# Patient Record
Sex: Male | Born: 1970
Health system: Southern US, Community
[De-identification: ages and names within clinical notes are randomized; demographics above are authoritative.]

## PROBLEM LIST (undated history)

## (undated) DIAGNOSIS — I471 Supraventricular tachycardia, unspecified: Secondary | ICD-10-CM

## (undated) DIAGNOSIS — I10 Essential (primary) hypertension: Secondary | ICD-10-CM

## (undated) DIAGNOSIS — N200 Calculus of kidney: Secondary | ICD-10-CM

## (undated) HISTORY — DX: Supraventricular tachycardia: I47.1

## (undated) HISTORY — PX: CYSTECTOMY: SUR359

## (undated) HISTORY — DX: Supraventricular tachycardia, unspecified: I47.10

## (undated) HISTORY — DX: Essential (primary) hypertension: I10

---

## 2008-12-08 ENCOUNTER — Emergency Department (HOSPITAL_COMMUNITY): Admission: EM | Admit: 2008-12-08 | Discharge: 2008-12-08 | Payer: Self-pay | Admitting: Emergency Medicine

## 2010-04-07 ENCOUNTER — Ambulatory Visit (INDEPENDENT_AMBULATORY_CARE_PROVIDER_SITE_OTHER): Payer: Federal, State, Local not specified - PPO | Admitting: Cardiology

## 2010-04-07 DIAGNOSIS — Z79899 Other long term (current) drug therapy: Secondary | ICD-10-CM

## 2010-04-07 DIAGNOSIS — I119 Hypertensive heart disease without heart failure: Secondary | ICD-10-CM

## 2010-05-25 LAB — COMPREHENSIVE METABOLIC PANEL
AST: 25 U/L (ref 0–37)
Albumin: 4.6 g/dL (ref 3.5–5.2)
CO2: 25 mEq/L (ref 19–32)
Calcium: 9.9 mg/dL (ref 8.4–10.5)
Creatinine, Ser: 0.85 mg/dL (ref 0.4–1.5)
GFR calc non Af Amer: >60 mL/min (ref >60)
Potassium: 4 mEq/L (ref 3.5–5.1)
Total Bilirubin: 0.7 mg/dL (ref 0.3–1.2)
Total Protein: 7.6 g/dL (ref 6.0–8.3)

## 2010-05-25 LAB — CBC
HCT: 47.8 % (ref 39.0–52.0)
MCV: 91.2 fL (ref 78.0–100.0)
RBC: 5.23 MIL/uL (ref 4.22–5.81)
WBC: 7.3 10*3/uL (ref 4.0–10.5)

## 2010-05-25 LAB — POCT CARDIAC MARKERS: CKMB, poc: <1 ng/mL — ABNORMAL LOW (ref 1.0–8.0)

## 2010-05-25 LAB — MAGNESIUM: Magnesium: 2 mg/dL (ref 1.5–2.5)

## 2010-05-25 LAB — DIFFERENTIAL
Basophils Absolute: 0 10*3/uL (ref 0.0–0.1)
Lymphocytes Relative: 18 % (ref 12–46)
Neutro Abs: 5.6 10*3/uL (ref 1.7–7.7)
Neutrophils Relative %: 76 % (ref 43–77)

## 2010-05-25 LAB — PROTIME-INR: Prothrombin Time: 12.4 seconds (ref 11.6–15.2)

## 2010-06-15 ENCOUNTER — Emergency Department (HOSPITAL_COMMUNITY): Payer: Federal, State, Local not specified - PPO

## 2010-06-15 ENCOUNTER — Emergency Department (HOSPITAL_COMMUNITY)
Admission: EM | Admit: 2010-06-15 | Discharge: 2010-06-16 | Disposition: A | Discharge status: Home / Self Care | Payer: Federal, State, Local not specified - PPO | Attending: Emergency Medicine | Admitting: Emergency Medicine

## 2010-06-15 DIAGNOSIS — I10 Essential (primary) hypertension: Secondary | ICD-10-CM | POA: Insufficient documentation

## 2010-06-15 DIAGNOSIS — R000 Tachycardia, unspecified: Secondary | ICD-10-CM | POA: Insufficient documentation

## 2010-06-15 DIAGNOSIS — R002 Palpitations: Secondary | ICD-10-CM | POA: Insufficient documentation

## 2010-06-15 LAB — POCT CARDIAC MARKERS: CKMB, poc: <1 ng/mL — ABNORMAL LOW (ref 1.0–8.0)

## 2010-06-15 LAB — POCT I-STAT, CHEM 8
BUN: 17 mg/dL (ref 6–23)
Creatinine, Ser: 1 mg/dL (ref 0.4–1.5)
HCT: 44 % (ref 39.0–52.0)
Potassium: 3.4 mEq/L — ABNORMAL LOW (ref 3.5–5.1)
Sodium: 135 mEq/L (ref 135–145)
TCO2: 25 mmol/L (ref 0–100)

## 2010-06-16 ENCOUNTER — Telehealth: Payer: Self-pay | Admitting: Cardiology

## 2010-06-16 LAB — DIFFERENTIAL
Basophils Relative: 0 % (ref 0–1)
Eosinophils Absolute: 0.2 10*3/uL (ref 0.0–0.7)
Lymphocytes Relative: 40 % (ref 12–46)
Monocytes Absolute: 0.5 10*3/uL (ref 0.1–1.0)
Monocytes Relative: 8 % (ref 3–12)
Neutrophils Relative %: 50 % (ref 43–77)

## 2010-06-16 LAB — CBC
HCT: 42.3 % (ref 39.0–52.0)
MCH: 31 pg (ref 26.0–34.0)
MCV: 86.9 fL (ref 78.0–100.0)
Platelets: 278 10*3/uL (ref 150–400)
RDW: 12 % (ref 11.5–15.5)

## 2010-06-16 NOTE — Telephone Encounter (Signed)
Left message

## 2010-06-16 NOTE — Telephone Encounter (Signed)
FYI AT 10PM HAD TACHACARDICA  WENT TO [place] OVER NIGHT  IS DOING OK TODAY

## 2010-06-28 NOTE — Telephone Encounter (Signed)
Patient never called back.  Had discussed with Lawson Fiscal since Dr. Patty Sermons was out of the office and was going to get an event monitor on patient.

## 2010-09-12 ENCOUNTER — Other Ambulatory Visit: Payer: Self-pay | Admitting: Cardiology

## 2010-09-12 DIAGNOSIS — I119 Hypertensive heart disease without heart failure: Secondary | ICD-10-CM

## 2010-09-12 NOTE — Telephone Encounter (Signed)
escribe request  

## 2010-10-11 ENCOUNTER — Other Ambulatory Visit: Payer: Self-pay | Admitting: Cardiology

## 2010-10-11 DIAGNOSIS — I119 Hypertensive heart disease without heart failure: Secondary | ICD-10-CM

## 2010-10-11 NOTE — Telephone Encounter (Signed)
escribe request  

## 2011-04-13 ENCOUNTER — Other Ambulatory Visit: Payer: Self-pay | Admitting: Cardiology

## 2011-04-13 NOTE — Telephone Encounter (Signed)
Refilled lisinopril

## 2011-05-01 ENCOUNTER — Ambulatory Visit (INDEPENDENT_AMBULATORY_CARE_PROVIDER_SITE_OTHER): Payer: Federal, State, Local not specified - PPO | Admitting: Cardiology

## 2011-05-01 ENCOUNTER — Encounter: Payer: Self-pay | Admitting: Cardiology

## 2011-05-01 VITALS — BP 155/88 | HR 76 | Ht 70.0 in | Wt 200.2 lb

## 2011-05-01 DIAGNOSIS — I119 Hypertensive heart disease without heart failure: Secondary | ICD-10-CM

## 2011-05-01 DIAGNOSIS — R002 Palpitations: Secondary | ICD-10-CM | POA: Insufficient documentation

## 2011-05-01 MED ORDER — AMLODIPINE BESYLATE 10 MG PO TABS: 10.0000 mg | ORAL_TABLET | Freq: Every day | ORAL | Status: DC

## 2011-05-01 MED ORDER — LISINOPRIL-HYDROCHLOROTHIAZIDE 20-25 MG PO TABS: 1.0000 | ORAL_TABLET | Freq: Every day | ORAL | Status: DC

## 2011-05-01 NOTE — Assessment & Plan Note (Signed)
Checked his blood pressure at home and it is in the range of 130 up into 150 systolic and the diastolic runs between 80 and 90.  He has gained weight since we last saw him and he is trying to lose the weight.  He is not having any chest pain or shortness of breath or palpitations.  He's had no further palpitations since he cut caffeine from his diet

## 2011-05-01 NOTE — Progress Notes (Signed)
Leroy Leonard Date of Birth:  17-May-1970 [facility] [address] [place], [place]  [address] [phone]  Fax   [phone]  HPI: This pleasant 41 year old gentleman is seen for a scheduled followup office visit.  He has a history of essential hypertension.  Is seen today after a long absence.  Since last visit he's had no new cardiac symptoms.  Since we last saw him he has bought a new house and [place] his wife and 2 young children.  He works with the [employer].  He does not use tobacco.  He continues to try to get moderate exercise and also does some weight training at home.  Current Outpatient Prescriptions  Medication Sig Dispense Refill  . amLODipine (NORVASC) 10 MG tablet Take 1 tablet (10 mg total) by mouth daily.  30 tablet  11  . lisinopril-hydrochlorothiazide (PRINZIDE,ZESTORETIC) 20-25 MG per tablet Take 1 tablet by mouth daily.  30 tablet  11  . metoprolol (TOPROL-XL) 100 MG 24 hr tablet TAKE ONE TABLET BY MOUTH EVERY DAY  30 tablet  11  . Multiple Vitamin (MULTIVITAMIN) tablet Take 1 tablet by mouth daily.        No Known Allergies  Patient Active Problem List  Diagnoses  . Benign hypertensive heart disease without heart failure    History  Smoking status  . Former Smoker  Smokeless tobacco  . Not on file    History  Alcohol Use: Not on file    No family history on file.  Review of Systems: The patient denies any heat or cold intolerance.  No weight gain or weight loss.  The patient denies headaches or blurry vision.  There is no cough or sputum production.  The patient denies dizziness.  There is no hematuria or hematochezia.  The patient denies any muscle aches or arthritis.  The patient denies any rash.  The patient denies frequent falling or instability.  There is no history of depression or anxiety.  All other systems were reviewed and are negative.   Physical Exam: Filed Vitals:   05/01/11  1054  BP: 155/88  Pulse: 76   general appearance reveals a well-developed well-nourished gentleman in no distress.The head and neck exam reveals pupils equal and reactive.  Extraocular movements are full.  There is no scleral icterus.  The mouth and pharynx are normal.  The neck is supple.  The carotids reveal no bruits.  The jugular venous pressure is normal.  The  thyroid is not enlarged.  There is no lymphadenopathy.  The chest is clear to percussion and auscultation.  There are no rales or rhonchi.  Expansion of the chest is symmetrical.  The precordium is quiet.  The first heart sound is normal.  The second heart sound is physiologically split.  There is no murmur gallop rub or click.  There is no abnormal lift or heave.  The abdomen is soft and nontender.  The bowel sounds are normal.  The liver and spleen are not enlarged.  There are no abdominal masses.  There are no abdominal bruits.  Extremities reveal good pedal pulses.  There is no phlebitis or edema.  There is no cyanosis or clubbing.  Strength is normal and symmetrical in all extremities.  There is no lateralizing weakness.  There are no sensory deficits.  The skin is warm and dry.  There is no rash.      Assessment / Plan: Continue same medication except increase  amlodipine to 10 mg daily. Return in 6 months for followup office visit and we will get fasting lipid panel and chemistries as well

## 2011-05-01 NOTE — Patient Instructions (Signed)
Increase your Amlodipine to 10 mg daily   Work harder on weight loss  Your physician wants you to follow-up in: 6 months You will receive a reminder letter in the mail two months in advance. If you don't receive a letter, please call our office to schedule the follow-up appointment.

## 2011-05-01 NOTE — Assessment & Plan Note (Signed)
He has not been aware of any cardiac irregularity.  He continues to avoid caffeine

## 2011-07-02 ENCOUNTER — Encounter: Payer: Self-pay | Admitting: *Deleted

## 2011-10-10 ENCOUNTER — Other Ambulatory Visit: Payer: Self-pay | Admitting: Cardiology

## 2011-10-10 NOTE — Telephone Encounter (Signed)
Refilled metoprolol 

## 2011-11-13 ENCOUNTER — Other Ambulatory Visit (INDEPENDENT_AMBULATORY_CARE_PROVIDER_SITE_OTHER): Payer: Federal, State, Local not specified - PPO

## 2011-11-13 DIAGNOSIS — I119 Hypertensive heart disease without heart failure: Secondary | ICD-10-CM

## 2011-11-13 LAB — HEPATIC FUNCTION PANEL
ALT: 41 U/L (ref 0–53)
AST: 27 U/L (ref 0–37)
Alkaline Phosphatase: 58 U/L (ref 39–117)
Bilirubin, Direct: 0 mg/dL (ref 0.0–0.3)
Total Bilirubin: 0.7 mg/dL (ref 0.3–1.2)

## 2011-11-13 LAB — LDL CHOLESTEROL, DIRECT: Direct LDL: 150.8 mg/dL

## 2011-11-13 LAB — BASIC METABOLIC PANEL
BUN: 16 mg/dL (ref 6–23)
Creatinine, Ser: 1 mg/dL (ref 0.4–1.5)
Sodium: 140 mEq/L (ref 135–145)

## 2011-11-13 LAB — LIPID PANEL
Cholesterol: 232 mg/dL — ABNORMAL HIGH (ref 0–200)
HDL: 42.5 mg/dL (ref >39.00)
VLDL: 39.8 mg/dL (ref 0.0–40.0)

## 2011-11-13 NOTE — Progress Notes (Signed)
Quick Note:  Please make copy of labs for patient visit. ______ 

## 2011-11-20 ENCOUNTER — Ambulatory Visit (INDEPENDENT_AMBULATORY_CARE_PROVIDER_SITE_OTHER): Payer: Federal, State, Local not specified - PPO | Admitting: Cardiology

## 2011-11-20 ENCOUNTER — Encounter: Payer: Self-pay | Admitting: Cardiology

## 2011-11-20 VITALS — BP 137/84 | HR 80 | Ht 70.0 in | Wt 201.0 lb

## 2011-11-20 DIAGNOSIS — E7800 Pure hypercholesterolemia, unspecified: Secondary | ICD-10-CM

## 2011-11-20 DIAGNOSIS — I119 Hypertensive heart disease without heart failure: Secondary | ICD-10-CM

## 2011-11-20 MED ORDER — ATORVASTATIN CALCIUM 40 MG PO TABS: 40.0000 mg | ORAL_TABLET | Freq: Every day | ORAL | Status: DC

## 2011-11-20 NOTE — Assessment & Plan Note (Signed)
Blood pressure has been remaining acceptable on current therapy.  No headaches or dizzy spells or palpitations

## 2011-11-20 NOTE — Patient Instructions (Addendum)
Start Lipitor 40 mg daily   Come back for fasting labs 01/22/12 after 8:30  Your physician wants you to follow-up in: 6 months with fasting labs (lp/bmet/hfp)  You will receive a reminder letter in the mail two months in advance. If you don't receive a letter, please call our office to schedule the follow-up appointment.

## 2011-11-20 NOTE — Progress Notes (Signed)
Leroy Leonard Date of Birth:  1971/01/12 [facility] [address] [place], [place]  [address] [phone]         Fax   [phone]  History of Present Illness: This pleasant 41 year old gentleman is seen for a scheduled followup office visit. He has a history of essential hypertension. Is seen today after a six-month absence. Since last visit he's had no new cardiac symptoms. Since we last saw him he has bought a new house and now [place] the country with his wife and 2 young children. He works with the [employer]. He does not use tobacco. He continues to try to get moderate exercise and also does some weight training at home.  He and his wife have been doing some biking on the weekends riding 10-12 miles on the paths around the [facility] system.   Current Outpatient Prescriptions  Medication Sig Dispense Refill  . amLODipine (NORVASC) 10 MG tablet Take 1 tablet (10 mg total) by mouth daily.  30 tablet  11  . lisinopril-hydrochlorothiazide (PRINZIDE,ZESTORETIC) 20-25 MG per tablet Take 1 tablet by mouth daily.  30 tablet  11  . metoprolol succinate (TOPROL-XL) 100 MG 24 hr tablet TAKE ONE TABLET BY MOUTH EVERY DAY  30 tablet  5  . Multiple Vitamin (MULTIVITAMIN) tablet Take 1 tablet by mouth daily.      Marland Kitchen atorvastatin (LIPITOR) 40 MG tablet Take 1 tablet (40 mg total) by mouth daily.  30 tablet  5    No Known Allergies  Patient Active Problem List  Diagnosis  . Benign hypertensive heart disease without heart failure  . Rapid palpitations    History  Smoking status  . Former Smoker  Smokeless tobacco  . Not on file    History  Alcohol Use  . 2.0 oz/week  . 4 drink(s) per week    Family History  Problem Relation Age of Onset  . COPD    . Hypertension      Review of Systems: Constitutional: no fever chills diaphoresis or fatigue or change in weight.  Head and neck: no hearing loss, no epistaxis, no photophobia or visual  disturbance. Respiratory: No cough, shortness of breath or wheezing. Cardiovascular: No chest pain peripheral edema, palpitations. Gastrointestinal: No abdominal distention, no abdominal pain, no change in bowel habits hematochezia or melena. Genitourinary: No dysuria, no frequency, no urgency, no nocturia. Musculoskeletal:No arthralgias, no back pain, no gait disturbance or myalgias. Neurological: No dizziness, no headaches, no numbness, no seizures, no syncope, no weakness, no tremors. Hematologic: No lymphadenopathy, no easy bruising. Psychiatric: No confusion, no hallucinations, no sleep disturbance.    Physical Exam: Filed Vitals:   11/20/11 1513  BP: 137/84  Pulse: 80   the general appearance reveals a well-developed well-nourished gentleman in no distress.The head and neck exam reveals pupils equal and reactive.  Extraocular movements are full.  There is no scleral icterus.  The mouth and pharynx are normal.  The neck is supple.  The carotids reveal no bruits.  The jugular venous pressure is normal.  The  thyroid is not enlarged.  There is no lymphadenopathy.  The chest is clear to percussion and auscultation.  There are no rales or rhonchi.  Expansion of the chest is symmetrical.  The precordium is quiet.  The first heart sound is normal.  The second heart sound is physiologically split.  There is no murmur gallop rub or click.  There is no abnormal lift or heave.  The  abdomen is soft and nontender.  The bowel sounds are normal.  The liver and spleen are not enlarged.  There are no abdominal masses.  There are no abdominal bruits.  Extremities reveal good pedal pulses.  There is no phlebitis or edema.  There is no cyanosis or clubbing.  Strength is normal and symmetrical in all extremities.  There is no lateralizing weakness.  There are no sensory deficits.  The skin is warm and dry.  There is no rash.    Assessment / Plan: Continue present medication and add Lipitor 40 mg one  daily. Return in 2 months for lab work alone and in 6 months for office visit and fasting lab work

## 2011-11-20 NOTE — Assessment & Plan Note (Signed)
We reviewed his recent blood work which shows that his LDL cholesterol is 150 and his total cholesterol is 232.  He has a history of premature coronary artery disease on his mothers side.  High blood pressure runs on both sides of the family.  He will continue a heart healthy diet and we will add Lipitor generic 40 mg one daily to his regimen.  He was warned to be on the lookout for possible leg cramps or myalgias.  He will return in 2 months just for lab work to check lipid panel and hepatic function panel.

## 2011-12-12 ENCOUNTER — Telehealth: Payer: Self-pay | Admitting: Cardiology

## 2011-12-12 NOTE — Telephone Encounter (Signed)
New problem:  Planning on getting the  flu shot will it interactive with his heart medication .

## 2011-12-12 NOTE — Telephone Encounter (Signed)
Left message to call back  

## 2011-12-24 NOTE — Telephone Encounter (Signed)
Left message on voice mail, ok to get flu vaccine

## 2011-12-24 NOTE — Telephone Encounter (Signed)
I agree

## 2012-01-04 ENCOUNTER — Encounter: Payer: Self-pay | Admitting: Cardiology

## 2012-01-22 ENCOUNTER — Other Ambulatory Visit (INDEPENDENT_AMBULATORY_CARE_PROVIDER_SITE_OTHER): Payer: Federal, State, Local not specified - PPO

## 2012-01-22 DIAGNOSIS — E7800 Pure hypercholesterolemia, unspecified: Secondary | ICD-10-CM

## 2012-01-22 LAB — HEPATIC FUNCTION PANEL
Albumin: 4.5 g/dL (ref 3.5–5.2)
Alkaline Phosphatase: 60 U/L (ref 39–117)
Bilirubin, Direct: 0.1 mg/dL (ref 0.0–0.3)

## 2012-01-22 LAB — LIPID PANEL
HDL: 45.1 mg/dL (ref >39.00)
Total CHOL/HDL Ratio: 3
VLDL: 29.4 mg/dL (ref 0.0–40.0)

## 2012-01-24 ENCOUNTER — Telehealth: Payer: Self-pay | Admitting: *Deleted

## 2012-01-24 NOTE — Telephone Encounter (Signed)
Message copied by Burnell Blanks on Thu Jan 24, 2012  3:27 PM ------      Message from: Cassell Clement      Created: Tue Jan 22, 2012  4:48 PM       Please report.  The cholesterol and LDL now in the normal range with the help of Lipitor 40 mg daily.  Liver tests are fine.  Continue same medication

## 2012-01-24 NOTE — Telephone Encounter (Signed)
Advised patient of lab results  

## 2012-04-08 ENCOUNTER — Other Ambulatory Visit: Payer: Self-pay | Admitting: *Deleted

## 2012-04-08 MED ORDER — METOPROLOL SUCCINATE ER 100 MG PO TB24: 100.0000 mg | ORAL_TABLET | Freq: Every day | ORAL | Status: DC

## 2012-04-22 ENCOUNTER — Ambulatory Visit: Payer: Federal, State, Local not specified - PPO | Admitting: Cardiology

## 2012-04-22 ENCOUNTER — Other Ambulatory Visit: Payer: Federal, State, Local not specified - PPO

## 2012-04-28 ENCOUNTER — Other Ambulatory Visit: Payer: Self-pay | Admitting: *Deleted

## 2012-04-28 DIAGNOSIS — I119 Hypertensive heart disease without heart failure: Secondary | ICD-10-CM

## 2012-04-28 MED ORDER — AMLODIPINE BESYLATE 10 MG PO TABS: 10.0000 mg | ORAL_TABLET | Freq: Every day | ORAL | Status: DC

## 2012-04-28 MED ORDER — LISINOPRIL-HYDROCHLOROTHIAZIDE 20-25 MG PO TABS: 1.0000 | ORAL_TABLET | Freq: Every day | ORAL | Status: DC

## 2012-05-01 ENCOUNTER — Encounter: Payer: Self-pay | Admitting: Cardiology

## 2012-05-01 ENCOUNTER — Ambulatory Visit (INDEPENDENT_AMBULATORY_CARE_PROVIDER_SITE_OTHER): Payer: Federal, State, Local not specified - PPO | Admitting: Cardiology

## 2012-05-01 ENCOUNTER — Telehealth: Payer: Self-pay | Admitting: *Deleted

## 2012-05-01 ENCOUNTER — Other Ambulatory Visit (INDEPENDENT_AMBULATORY_CARE_PROVIDER_SITE_OTHER): Payer: Federal, State, Local not specified - PPO

## 2012-05-01 VITALS — BP 138/86 | HR 87 | Ht 70.0 in | Wt 194.8 lb

## 2012-05-01 DIAGNOSIS — I119 Hypertensive heart disease without heart failure: Secondary | ICD-10-CM

## 2012-05-01 DIAGNOSIS — E7800 Pure hypercholesterolemia, unspecified: Secondary | ICD-10-CM

## 2012-05-01 DIAGNOSIS — R002 Palpitations: Secondary | ICD-10-CM

## 2012-05-01 LAB — HEPATIC FUNCTION PANEL
AST: 21 U/L (ref 0–37)
Albumin: 4.4 g/dL (ref 3.5–5.2)
Alkaline Phosphatase: 54 U/L (ref 39–117)
Bilirubin, Direct: 0 mg/dL (ref 0.0–0.3)

## 2012-05-01 LAB — LIPID PANEL
Cholesterol: 178 mg/dL (ref 0–200)
Total CHOL/HDL Ratio: 4

## 2012-05-01 LAB — BASIC METABOLIC PANEL
BUN: 13 mg/dL (ref 6–23)
CO2: 28 mEq/L (ref 19–32)
Calcium: 9.8 mg/dL (ref 8.4–10.5)
Creatinine, Ser: 1 mg/dL (ref 0.4–1.5)

## 2012-05-01 NOTE — Telephone Encounter (Signed)
Message copied by Burnell Blanks on Thu May 01, 2012  5:59 PM ------      Message from: Cassell Clement      Created: Thu May 01, 2012  4:05 PM       Please report to patient.  The recent labs are stable. Continue same medication and careful diet. ------

## 2012-05-01 NOTE — Telephone Encounter (Signed)
Mailed copy of labs and left message to call if any questions  

## 2012-05-01 NOTE — Progress Notes (Addendum)
Leroy Leonard Date of Birth:  21-Aug-1970 [facility] [address] [place], [place]  [address] [phone]  Fax   [phone]  HPI: This pleasant 42 year old gentleman is seen for a scheduled followup office visit. He has a history of essential hypertension. Is seen today after a six-month absence. Since last visit he's had no new cardiac symptoms. Since we last saw him he has bought a new house and now [place] the [place] with his wife and 2 young children. He works with the [employer]. He does not use tobacco. He continues to try to get moderate exercise and also does some weight training at home. He and his wife have been doing some biking on the weekends riding 10-12 miles on the paths around the [facility] system.   Current Outpatient Prescriptions  Medication Sig Dispense Refill  . amLODipine (NORVASC) 10 MG tablet Take 1 tablet (10 mg total) by mouth daily.  30 tablet  1  . atorvastatin (LIPITOR) 40 MG tablet Take 1 tablet (40 mg total) by mouth daily.  30 tablet  5  . lisinopril-hydrochlorothiazide (PRINZIDE,ZESTORETIC) 20-25 MG per tablet Take 1 tablet by mouth daily.  30 tablet  1  . metoprolol succinate (TOPROL-XL) 100 MG 24 hr tablet Take 1 tablet (100 mg total) by mouth daily. Take with or immediately following a meal.  30 tablet  5  . Multiple Vitamin (MULTIVITAMIN) tablet Take 1 tablet by mouth daily.       No current facility-administered medications for this visit.    No Known Allergies  Patient Active Problem List  Diagnosis  . Benign hypertensive heart disease without heart failure  . Rapid palpitations  . Pure hypercholesterolemia    History  Smoking status  . Former Smoker  Smokeless tobacco  . Not on file    History  Alcohol Use  . 2.0 oz/week  . 4 drink(s) per week    Family History  Problem Relation Age of Onset  . COPD    . Hypertension      Review of Systems: The patient denies any heat or cold  intolerance.  No weight gain or weight loss.  The patient denies headaches or blurry vision.  There is no cough or sputum production.  The patient denies dizziness.  There is no hematuria or hematochezia.  The patient denies any muscle aches or arthritis.  The patient denies any rash.  The patient denies frequent falling or instability.  There is no history of depression or anxiety.  All other systems were reviewed and are negative.   Physical Exam: Filed Vitals:   05/01/12 0919  BP: 138/86  Pulse: 87   the general appearance feels a well-developed well-nourished gentleman in no distress.The head and neck exam reveals pupils equal and reactive.  Extraocular movements are full.  There is no scleral icterus.  The mouth and pharynx are normal.  The neck is supple.  The carotids reveal no bruits.  The jugular venous pressure is normal.  The  thyroid is not enlarged.  There is no lymphadenopathy.  The chest is clear to percussion and auscultation.  There are no rales or rhonchi.  Expansion of the chest is symmetrical.  The precordium is quiet.  The first heart sound is normal.  The second heart sound is physiologically split.  There is no murmur gallop rub or click.  There is no abnormal lift or heave.  The abdomen is soft and nontender.  The bowel sounds  are normal.  The liver and spleen are not enlarged.  There are no abdominal masses.  There are no abdominal bruits.  Extremities reveal good pedal pulses.  There is no phlebitis or edema.  There is no cyanosis or clubbing.  Strength is normal and symmetrical in all extremities.  There is no lateralizing weakness.  There are no sensory deficits.  The skin is warm and dry.  There is no rash.      Assessment / Plan:  Continue same medication.  Patient appears to be doing well.  Blood work today pending.  Recheck 6 months for followup office visit and fasting lab work.

## 2012-05-01 NOTE — Assessment & Plan Note (Signed)
The patient has not had any recurrent rapid tachycardia palpitations since last visit

## 2012-05-01 NOTE — Progress Notes (Signed)
Quick Note:  Please report to patient. The recent labs are stable. Continue same medication and careful diet. ______ 

## 2012-05-01 NOTE — Telephone Encounter (Signed)
Message copied by Burnell Blanks on Thu May 01, 2012  5:58 PM ------      Message from: Cassell Clement      Created: Thu May 01, 2012  4:05 PM       Please report to patient.  The recent labs are stable. Continue same medication and careful diet. ------

## 2012-05-01 NOTE — Assessment & Plan Note (Signed)
The patient is not having any chest pain or shortness of breath.  Energy level is good.  He is exercising and has lost 7 pounds since last visit.

## 2012-05-01 NOTE — Patient Instructions (Signed)
Will obtain labs today and call you with the results (lp.cmet/hfp)  Your physician recommends that you continue on your current medications as directed. Please refer to the Current Medication list given to you today.  Your physician wants you to follow-up in: 6 months with fasting labs (lp/bmet/hfp)  You will receive a reminder letter in the mail two months in advance. If you don't receive a letter, please call our office to schedule the follow-up appointment.

## 2012-05-01 NOTE — Assessment & Plan Note (Signed)
The patient has a history of hypercholesterolemia and is tolerating Lipitor 40 mg daily.  We are checking fasting lab work today.  He will continue same medication.  His diet now resembles a Mediterranean diet with a lot of fish fruits and vegetables and very little red meat.

## 2012-05-26 ENCOUNTER — Other Ambulatory Visit: Payer: Self-pay | Admitting: *Deleted

## 2012-05-26 MED ORDER — ATORVASTATIN CALCIUM 40 MG PO TABS: 40.0000 mg | ORAL_TABLET | Freq: Every day | ORAL | Status: DC

## 2012-06-25 ENCOUNTER — Other Ambulatory Visit: Payer: Self-pay | Admitting: *Deleted

## 2012-06-25 DIAGNOSIS — I119 Hypertensive heart disease without heart failure: Secondary | ICD-10-CM

## 2012-06-25 MED ORDER — AMLODIPINE BESYLATE 10 MG PO TABS: 10.0000 mg | ORAL_TABLET | Freq: Every day | ORAL | Status: DC

## 2012-06-25 MED ORDER — LISINOPRIL-HYDROCHLOROTHIAZIDE 20-25 MG PO TABS: 1.0000 | ORAL_TABLET | Freq: Every day | ORAL | Status: DC

## 2012-09-10 IMAGING — CR DG CHEST 1V PORT
1 series · 1 of 1 positions shown · non-contrast
Comparison: Chest 12/08/2008.

CLINICAL DATA: Tachycardia.

PORTABLE CHEST - 1 VIEW

[AP]
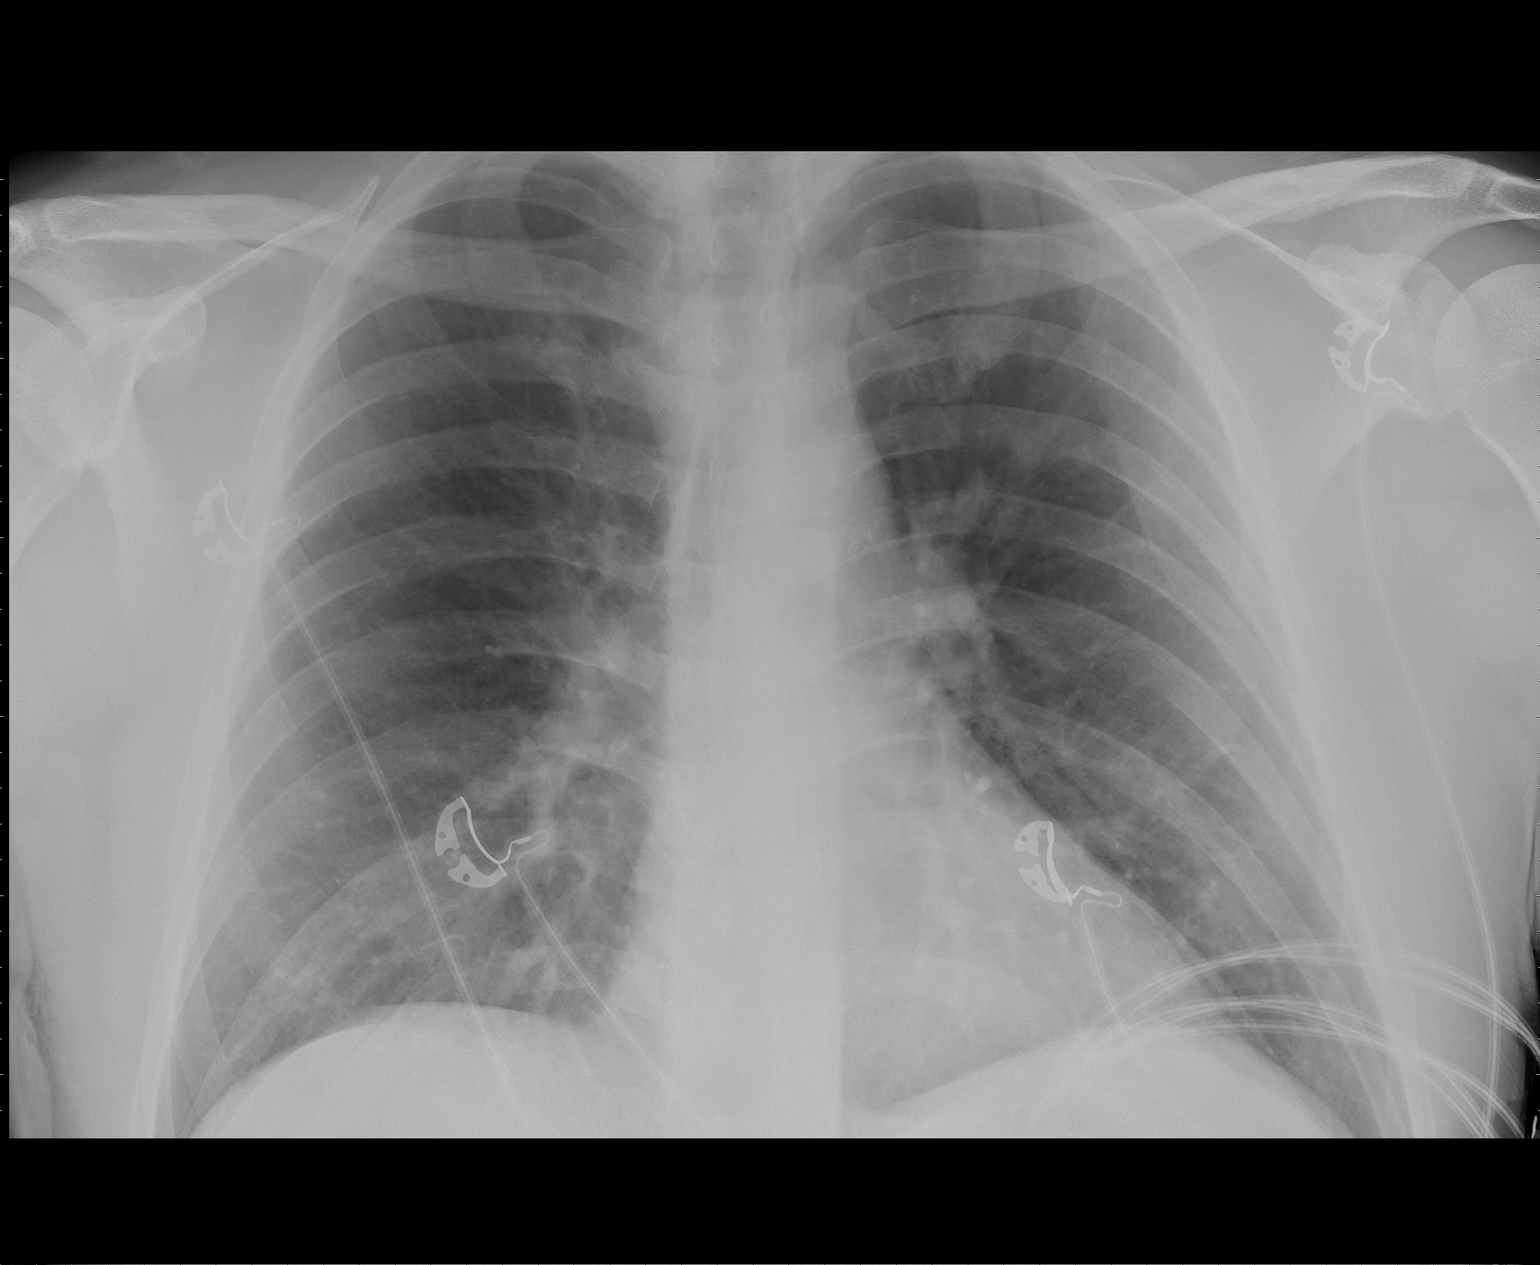

[1 of 1 positions shown; findings below may reference images not displayed]

FINDINGS: Lungs are clear.  Heart size is normal.  No pneumothorax
or pleural effusion.
IMPRESSION: Negative chest.

## 2012-09-23 ENCOUNTER — Other Ambulatory Visit: Payer: Self-pay | Admitting: *Deleted

## 2012-09-23 MED ORDER — METOPROLOL SUCCINATE ER 100 MG PO TB24: 100.0000 mg | ORAL_TABLET | Freq: Every day | ORAL | Status: DC

## 2012-11-25 ENCOUNTER — Other Ambulatory Visit: Payer: Self-pay

## 2012-11-25 MED ORDER — ATORVASTATIN CALCIUM 40 MG PO TABS: 40.0000 mg | ORAL_TABLET | Freq: Every day | ORAL | Status: DC

## 2012-12-05 ENCOUNTER — Other Ambulatory Visit: Payer: Self-pay | Admitting: Cardiology

## 2012-12-25 ENCOUNTER — Other Ambulatory Visit: Payer: Self-pay

## 2012-12-25 DIAGNOSIS — I119 Hypertensive heart disease without heart failure: Secondary | ICD-10-CM

## 2012-12-25 MED ORDER — ATORVASTATIN CALCIUM 40 MG PO TABS: 40.0000 mg | ORAL_TABLET | Freq: Every day | ORAL | Status: DC

## 2012-12-25 MED ORDER — AMLODIPINE BESYLATE 10 MG PO TABS: 10.0000 mg | ORAL_TABLET | Freq: Every day | ORAL | Status: DC

## 2012-12-25 MED ORDER — METOPROLOL SUCCINATE ER 100 MG PO TB24: ORAL_TABLET | ORAL | Status: DC

## 2012-12-25 MED ORDER — LISINOPRIL-HYDROCHLOROTHIAZIDE 20-25 MG PO TABS: 1.0000 | ORAL_TABLET | Freq: Every day | ORAL | Status: DC

## 2013-01-12 ENCOUNTER — Encounter: Payer: Self-pay | Admitting: Cardiology

## 2013-01-12 ENCOUNTER — Ambulatory Visit (INDEPENDENT_AMBULATORY_CARE_PROVIDER_SITE_OTHER): Payer: Federal, State, Local not specified - PPO | Admitting: Cardiology

## 2013-01-12 VITALS — BP 140/84 | HR 86 | Ht 70.0 in | Wt 202.0 lb

## 2013-01-12 DIAGNOSIS — E7800 Pure hypercholesterolemia, unspecified: Secondary | ICD-10-CM

## 2013-01-12 DIAGNOSIS — I119 Hypertensive heart disease without heart failure: Secondary | ICD-10-CM

## 2013-01-12 DIAGNOSIS — R002 Palpitations: Secondary | ICD-10-CM

## 2013-01-12 MED ORDER — ROSUVASTATIN CALCIUM 5 MG PO TABS: 5.0000 mg | ORAL_TABLET | Freq: Every day | ORAL | Status: DC

## 2013-01-12 NOTE — Patient Instructions (Signed)
STOP LIPITOR AND START CRESTOR 5 MG DAILY  Work on diet and weight loss  Your physician wants you to follow-up in: 6 months with fasting labs (lp/bmet/hfp) You will receive a reminder letter in the mail two months in advance. If you don't receive a letter, please call our office to schedule the follow-up appointment.

## 2013-01-12 NOTE — Assessment & Plan Note (Signed)
Blood pressure has been stable on current medication.  No chest pain or shortness of breath.

## 2013-01-12 NOTE — Assessment & Plan Note (Signed)
The patient has not had any recurrent SVT or rapid palpitations.

## 2013-01-12 NOTE — Progress Notes (Signed)
Leroy Leonard Date of Birth:  1970-04-18 [address] [place], [place]  [address] [phone]  Fax   [phone]  HPI: This pleasant 42 year old gentleman is seen for a scheduled followup office visit. He has a history of essential hypertension. Is seen today after a six-month absence. Since last visit he's had no new cardiac symptoms.  He works with the [employer]. He does not use tobacco. He continues to try to get moderate exercise and also does some weight training at home. He and his wife have been doing some biking on the weekends riding 10-12 miles on the paths around the [facility] system.  His weight is up a pound since last visit.  Since last visit he has developed some myalgias from Lipitor.  His symptoms improved when he stopped taking the Lipitor.  Interestingly, his mother has high cholesterol and developed myalgias on statins also.  Current Outpatient Prescriptions  Medication Sig Dispense Refill  . amLODipine (NORVASC) 10 MG tablet Take 1 tablet (10 mg total) by mouth daily.  30 tablet  3  . lisinopril-hydrochlorothiazide (PRINZIDE,ZESTORETIC) 20-25 MG per tablet Take 1 tablet by mouth daily.  30 tablet  3  . metoprolol succinate (TOPROL-XL) 100 MG 24 hr tablet TAKE ONE TABLET BY MOUTH DAILY. TAKE WITH OR IMMEDIATELY FOLLOWING A MEAL.  30 tablet  3  . Multiple Vitamin (MULTIVITAMIN) tablet Take 1 tablet by mouth daily.      . rosuvastatin (CRESTOR) 5 MG tablet Take 1 tablet (5 mg total) by mouth daily.  30 tablet  11   No current facility-administered medications for this visit.    Allergies  Allergen Reactions  . Lipitor [Atorvastatin]     Myalgias    Patient Active Problem List   Diagnosis Date Noted  . Pure hypercholesterolemia 11/20/2011  . Benign hypertensive heart disease without heart failure 05/01/2011  . Rapid palpitations 05/01/2011    History  Smoking status  . Former Smoker  Smokeless tobacco  . Not on file     History  Alcohol Use  . 2.0 oz/week  . 4 drink(s) per week    Family History  Problem Relation Age of Onset  . COPD    . Hypertension      Review of Systems: The patient denies any heat or cold intolerance.  No weight gain or weight loss.  The patient denies headaches or blurry vision.  There is no cough or sputum production.  The patient denies dizziness.  There is no hematuria or hematochezia.  The patient denies any muscle aches or arthritis.  The patient denies any rash.  The patient denies frequent falling or instability.  There is no history of depression or anxiety.  All other systems were reviewed and are negative.   Physical Exam: Filed Vitals:   01/12/13 1606  BP: 140/84  Pulse:    the general appearance feels a well-developed well-nourished gentleman in no distress.The head and neck exam reveals pupils equal and reactive.  Extraocular movements are full.  There is no scleral icterus.  The mouth and pharynx are normal.  The neck is supple.  The carotids reveal no bruits.  The jugular venous pressure is normal.  The  thyroid is not enlarged.  There is no lymphadenopathy.  The chest is clear to percussion and auscultation.  There are no rales or rhonchi.  Expansion of the chest is symmetrical.  The precordium is quiet.  The first heart sound is normal.  The  second heart sound is physiologically split.  There is no murmur gallop rub or click.  There is no abnormal lift or heave.  The abdomen is soft and nontender.  The bowel sounds are normal.  The liver and spleen are not enlarged.  There are no abdominal masses.  There are no abdominal bruits.  Extremities reveal good pedal pulses.  There is no phlebitis or edema.  There is no cyanosis or clubbing.  Strength is normal and symmetrical in all extremities.  There is no lateralizing weakness.  There are no sensory deficits.  The skin is warm and dry.  There is no rash.      Assessment / Plan:  Continue same medication except  stop Lipitor and start Crestor 5 mg daily.  Recheck in 6 months for office visit lipid panel hepatic function panel and basal metabolic panel.

## 2013-01-12 NOTE — Assessment & Plan Note (Signed)
Because of his myalgias in the legs we are stopping Lipitor.  He has already stopped it.  We will start a small dose of Crestor 5 mg daily.  He will let us know if he develops myalgias from the Crestor.

## 2013-01-26 ENCOUNTER — Telehealth: Payer: Self-pay | Admitting: Cardiology

## 2013-01-26 MED ORDER — SIMVASTATIN 10 MG PO TABS: 10.0000 mg | ORAL_TABLET | Freq: Every day | ORAL | Status: DC

## 2013-01-26 NOTE — Telephone Encounter (Signed)
Advised patient and sent Rx to Pharmacy

## 2013-01-26 NOTE — Telephone Encounter (Signed)
New problem    Pt has some questions about Crestor please give him a call back?

## 2013-01-26 NOTE — Telephone Encounter (Signed)
Will forward to  Dr. Brackbill for review 

## 2013-01-26 NOTE — Telephone Encounter (Signed)
Since Crestor is too expensive for him we will switch to simvastatin 10 mg one daily and see if he can tolerate this without myalgias.

## 2013-01-26 NOTE — Telephone Encounter (Signed)
Returned call to patient he stated Crestor cost too much.Patient is Dr.Brackbill's patient will send message to Dr.Brackbill's nurse Juliette Alcide.

## 2013-04-21 ENCOUNTER — Other Ambulatory Visit: Payer: Self-pay | Admitting: Cardiology

## 2013-05-26 ENCOUNTER — Other Ambulatory Visit: Payer: Self-pay | Admitting: *Deleted

## 2013-05-26 MED ORDER — AMLODIPINE BESYLATE 10 MG PO TABS: ORAL_TABLET | ORAL | Status: DC

## 2013-05-26 MED ORDER — METOPROLOL SUCCINATE ER 100 MG PO TB24: ORAL_TABLET | ORAL | Status: DC

## 2013-05-26 MED ORDER — LISINOPRIL-HYDROCHLOROTHIAZIDE 20-25 MG PO TABS: ORAL_TABLET | ORAL | Status: DC

## 2013-11-20 ENCOUNTER — Other Ambulatory Visit: Payer: Self-pay | Admitting: *Deleted

## 2013-11-20 MED ORDER — METOPROLOL SUCCINATE ER 100 MG PO TB24: ORAL_TABLET | ORAL | Status: DC

## 2013-11-20 MED ORDER — LISINOPRIL-HYDROCHLOROTHIAZIDE 20-25 MG PO TABS: ORAL_TABLET | ORAL | Status: DC

## 2013-11-20 MED ORDER — AMLODIPINE BESYLATE 10 MG PO TABS: ORAL_TABLET | ORAL | Status: DC

## 2014-01-05 ENCOUNTER — Ambulatory Visit (INDEPENDENT_AMBULATORY_CARE_PROVIDER_SITE_OTHER): Payer: Federal, State, Local not specified - PPO | Admitting: Cardiology

## 2014-01-05 ENCOUNTER — Encounter: Payer: Self-pay | Admitting: Cardiology

## 2014-01-05 VITALS — BP 130/82 | HR 56 | Ht 70.0 in | Wt 207.0 lb

## 2014-01-05 DIAGNOSIS — E7800 Pure hypercholesterolemia, unspecified: Secondary | ICD-10-CM

## 2014-01-05 DIAGNOSIS — R002 Palpitations: Secondary | ICD-10-CM

## 2014-01-05 DIAGNOSIS — I119 Hypertensive heart disease without heart failure: Secondary | ICD-10-CM

## 2014-01-05 NOTE — Patient Instructions (Signed)
Your physician recommends that you continue on your current medications as directed. Please refer to the Current Medication list given to you today.  Your physician recommends that you return for lab work on Monday 11/23 for fasting labs: lipid panel, hepatic function panel, and basic metabolic panel.  Your physician wants you to follow-up in: 1 year with Dr. Patty SermonsBrackbill. You will receive a reminder letter in the mail two months in advance. If you don't receive a letter, please call our office to schedule the follow-up appointment. We will also get an EKG and fasting lab work at this appointment.

## 2014-01-05 NOTE — Assessment & Plan Note (Signed)
He is currently not on any statin therapy.  He has not had any recent lab work.  He will return soon for fasting lipid panel hepatic function panel and basal metabolic panel

## 2014-01-05 NOTE — Progress Notes (Signed)
Leroy Leonard Date of Birth:  02/20/1970 [facility] [address] [place], [place]  [id number] [phone]  Fax   [phone]  HPI: This pleasant 43 year old gentleman is seen for a 1 year followup office visit. He has a history of essential hypertension. Since last visit he's had no new cardiac symptoms. He works with the [employer] office. He does not use tobacco. He continues to try to get moderate exercise and also does some weight training at home. He and his wife have been doing some biking on the weekends riding 10-12 miles [place] paths [place]since we last saw him he has also started to do kayaking. His weight is up 5 pounds since last visit. he has a history of intolerance to Lipitor.  He did tolerate Crestor but it was too expensive for him.   Current Outpatient Prescriptions  Medication Sig Dispense Refill  . amLODipine (NORVASC) 10 MG tablet TAKE 1 TABLET BY MOUTH EVERY DAY 90 tablet 0  . lisinopril-hydrochlorothiazide (PRINZIDE,ZESTORETIC) 20-25 MG per tablet TAKE 1 TABLET BY MOUTH EVERY DAY 90 tablet 0  . metoprolol succinate (TOPROL-XL) 100 MG 24 hr tablet TAKE 1 TABLET BY MOUTH DAILY WITH OR IMMEDIATELY FOLLOWING A MEAL 90 tablet 0  . Multiple Vitamin (MULTIVITAMIN) tablet Take 1 tablet by mouth daily.     No current facility-administered medications for this visit.    Allergies  Allergen Reactions  . Lipitor [Atorvastatin]     Myalgias    Patient Active Problem List   Diagnosis Date Noted  . Pure hypercholesterolemia 11/20/2011  . Benign hypertensive heart disease without heart failure 05/01/2011  . Rapid palpitations 05/01/2011    History  Smoking status  . Never Smoker   Smokeless tobacco  . Not on file    History  Alcohol Use  . 2.4 oz/week  . 4 Not specified per week    Family History  Problem Relation Age of Onset  . COPD    . Hypertension      Review of Systems: The patient denies  any heat or cold intolerance.  No weight gain or weight loss.  The patient denies headaches or blurry vision.  There is no cough or sputum production.  The patient denies dizziness.  There is no hematuria or hematochezia.  The patient denies any muscle aches or arthritis.  The patient denies any rash.  The patient denies frequent falling or instability.  There is no history of depression or anxiety.  All other systems were reviewed and are negative.   Physical Exam: Filed Vitals:   01/05/14 1528  BP: 130/82  Pulse: 56   The patient appears to be in no distress.  Head and neck exam reveals that the pupils are equal and reactive.  The extraocular movements are full.  There is no scleral icterus.  Mouth and pharynx are benign.  No lymphadenopathy.  No carotid bruits.  The jugular venous pressure is normal.  Thyroid is not enlarged or tender.  Chest is clear to percussion and auscultation.  No rales or rhonchi.  Expansion of the chest is symmetrical.  Heart reveals no abnormal lift or heave.  First and second heart sounds are normal.  There is no murmur gallop rub or click.  The abdomen is soft and nontender.  Bowel sounds are normoactive.  There is no hepatosplenomegaly or mass.  There are no abdominal bruits.  Extremities reveal no phlebitis or edema.  Pedal pulses are  good.  There is no cyanosis or clubbing.  Neurologic exam is normal strength and no lateralizing weakness.  No sensory deficits.  Integument reveals no rash    Assessment / Plan: 1. Essential hypertension without heart failure 2.  Past history of paroxysmal supraventricular tachycardia 3.  History of hypercholesterolemia  Disposition: Return soon for fasting lab work. Return in one year for office visit EKG and fasting lab work. Work harder on weight loss

## 2014-01-05 NOTE — Assessment & Plan Note (Signed)
His blood pressures remaining stable on current therapy

## 2014-01-05 NOTE — Assessment & Plan Note (Signed)
He has not had any further palpitations or tachycardia

## 2014-01-11 ENCOUNTER — Other Ambulatory Visit (INDEPENDENT_AMBULATORY_CARE_PROVIDER_SITE_OTHER): Payer: Federal, State, Local not specified - PPO | Admitting: *Deleted

## 2014-01-11 DIAGNOSIS — E7800 Pure hypercholesterolemia, unspecified: Secondary | ICD-10-CM

## 2014-01-11 DIAGNOSIS — I119 Hypertensive heart disease without heart failure: Secondary | ICD-10-CM

## 2014-01-11 LAB — BASIC METABOLIC PANEL
BUN: 12 mg/dL (ref 6–23)
CALCIUM: 9.7 mg/dL (ref 8.4–10.5)
CHLORIDE: 102 meq/L (ref 96–112)
CO2: 25 mEq/L (ref 19–32)
Creatinine, Ser: 1 mg/dL (ref 0.4–1.5)
GFR: 92.46 mL/min (ref >60.00)
Glucose, Bld: 92 mg/dL (ref 70–99)
Potassium: 4.4 mEq/L (ref 3.5–5.1)
Sodium: 137 mEq/L (ref 135–145)

## 2014-01-11 LAB — HEPATIC FUNCTION PANEL
ALBUMIN: 4.4 g/dL (ref 3.5–5.2)
ALT: 26 U/L (ref 0–53)
AST: 24 U/L (ref 0–37)
Alkaline Phosphatase: 55 U/L (ref 39–117)
Bilirubin, Direct: 0 mg/dL (ref 0.0–0.3)
Total Bilirubin: 0.6 mg/dL (ref 0.2–1.2)
Total Protein: 7.6 g/dL (ref 6.0–8.3)

## 2014-01-11 LAB — LIPID PANEL
CHOL/HDL RATIO: 5
Cholesterol: 214 mg/dL — ABNORMAL HIGH (ref 0–200)
HDL: 47 mg/dL (ref >39.00)
NonHDL: 167
TRIGLYCERIDES: 207 mg/dL — AB (ref 0.0–149.0)
VLDL: 41.4 mg/dL — ABNORMAL HIGH (ref 0.0–40.0)

## 2014-01-12 ENCOUNTER — Other Ambulatory Visit: Payer: Self-pay | Admitting: *Deleted

## 2014-01-12 DIAGNOSIS — E785 Hyperlipidemia, unspecified: Secondary | ICD-10-CM

## 2014-01-12 LAB — LDL CHOLESTEROL, DIRECT: LDL DIRECT: 139.6 mg/dL

## 2014-01-12 MED ORDER — ROSUVASTATIN CALCIUM 10 MG PO TABS: 5.0000 mg | ORAL_TABLET | ORAL | Status: DC

## 2014-02-12 ENCOUNTER — Other Ambulatory Visit: Payer: Self-pay | Admitting: Cardiology

## 2014-02-17 ENCOUNTER — Other Ambulatory Visit: Payer: Self-pay | Admitting: *Deleted

## 2014-02-17 MED ORDER — AMLODIPINE BESYLATE 10 MG PO TABS: 10.0000 mg | ORAL_TABLET | Freq: Every day | ORAL | Status: DC

## 2014-03-08 ENCOUNTER — Other Ambulatory Visit (INDEPENDENT_AMBULATORY_CARE_PROVIDER_SITE_OTHER): Payer: Federal, State, Local not specified - PPO | Admitting: *Deleted

## 2014-03-08 DIAGNOSIS — E785 Hyperlipidemia, unspecified: Secondary | ICD-10-CM

## 2014-03-08 LAB — LIPID PANEL
CHOLESTEROL: 156 mg/dL (ref 0–200)
HDL: 47.8 mg/dL (ref >39.00)
LDL CALC: 78 mg/dL (ref 0–99)
NonHDL: 108.2
Total CHOL/HDL Ratio: 3
Triglycerides: 149 mg/dL (ref 0.0–149.0)
VLDL: 29.8 mg/dL (ref 0.0–40.0)

## 2014-03-08 LAB — HEPATIC FUNCTION PANEL
ALBUMIN: 4.2 g/dL (ref 3.5–5.2)
ALT: 35 U/L (ref 0–53)
AST: 22 U/L (ref 0–37)
Alkaline Phosphatase: 60 U/L (ref 39–117)
BILIRUBIN DIRECT: 0.1 mg/dL (ref 0.0–0.3)
TOTAL PROTEIN: 7.2 g/dL (ref 6.0–8.3)
Total Bilirubin: 0.4 mg/dL (ref 0.2–1.2)

## 2014-03-08 NOTE — Progress Notes (Signed)
Quick Note:  Please report to patient. The recent labs are stable. Continue same medication and careful diet. ______ 

## 2014-05-18 ENCOUNTER — Other Ambulatory Visit: Payer: Self-pay | Admitting: Cardiology

## 2014-05-19 ENCOUNTER — Other Ambulatory Visit: Payer: Self-pay | Admitting: Cardiology

## 2014-06-15 ENCOUNTER — Telehealth: Payer: Self-pay | Admitting: Cardiology

## 2014-06-15 NOTE — Telephone Encounter (Signed)
Error

## 2014-11-03 ENCOUNTER — Other Ambulatory Visit: Payer: Self-pay | Admitting: Cardiology

## 2015-01-26 ENCOUNTER — Other Ambulatory Visit: Payer: Self-pay | Admitting: Cardiology

## 2015-02-03 ENCOUNTER — Encounter: Payer: Self-pay | Admitting: Cardiology

## 2015-02-06 NOTE — Progress Notes (Signed)
Cardiology Office Note   Date:  02/08/2015   ID:  Leroy Leonard, DOB 1970/03/07, MRN [id number]  PCP:  Delorse Lek, MD  Cardiologist:  Dr. Patty Sermons --> Dr. Delton See  1 year follow up    History of Present Illness: Leroy Leonard is a 44 y.o. male with a history of HTN, HLD and SVT who presents to clinic for 1 year follow up.   2D ECHO (2010) mild LVH, normal systolic and diastolic function. He was seen by Dr. Patty Sermons last year for yearly follow up and doing well from a cardiac standpoint with no symptoms. He was very active doing moderate exercise and weight training at home and riding bikes with his wife. He has a history of intolerance to Lipitor. He tolerated Crestor but it was too expensive. He was up 5 lbs and Dr. Patty Sermons recommended weight loss. Fasting lipid/hepatic panel done in 02/2014. LDL 78. LFTs normal.   Today he presents to clinic for follow up. No CP, SOB or palpitations. He walks the dog everyday. 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 He has no exertional symptoms. No LE edema, orthopnea or PND. He has been feeling quite well and has no complaints.  He has a strong family history of CAD. Maternal GF and maternal aunt had heart disease in 6s and 34s.    Past Medical History  Diagnosis Date  . HTN (hypertension)   . SVT (supraventricular tachycardia) [facility])     Past Surgical History  Procedure Laterality Date  . Cystectomy      leg     Current Outpatient Prescriptions  Medication Sig Dispense Refill  . amLODipine (NORVASC) 10 MG tablet Take 1 tablet (10 mg total) by mouth daily. 90 tablet 4  . lisinopril-hydrochlorothiazide (PRINZIDE,ZESTORETIC) 20-25 MG tablet TAKE 1 TABLET BY MOUTH EVERY DAY 90 tablet 0  . metoprolol succinate (TOPROL-XL) 100 MG 24 hr tablet TAKE 1 TABLET BY MOUTH EVERY DAY WITH OR IMMEDIATELY FOLLOWING A MEAL 90 tablet 0  . Multiple Vitamin (MULTIVITAMIN) tablet Take 1 tablet by  mouth daily.    . rosuvastatin (CRESTOR) 10 MG tablet Take 0.5 tablets (5 mg total) by mouth every other day. 45 tablet 3   No current facility-administered medications for this visit.    Allergies:   Lipitor    Social History:  The patient  reports that he has never smoked. He does not have any smokeless tobacco history on file. He reports that he drinks about 2.4 oz of alcohol per week.   Family History:  The patient's family history includes COPD in his father; Heart attack in his maternal grandfather; Hypertension in his father and mother.    ROS:  Please see the history of present illness.   Otherwise, review of systems are positive for NONE.   All other systems are reviewed and negative.    PHYSICAL EXAM: VS:  BP 132/78 mmHg  Pulse 82  Ht  (1.778 m)  Wt 203 lb 3.2 oz (92.171 kg)  BMI 29.16 kg/m2 , BMI Body mass index is 29.16 kg/(m^2). GEN: Well nourished, well developed, in no acute distress HEENT: normal Neck: no JVD, carotid bruits, or masses Cardiac: RRR; no murmurs, rubs, or gallops,no edema  Respiratory:  clear to auscultation bilaterally, normal work of breathing GI: soft, nontender, nondistended, + BS MS: no deformity or atrophy Skin: warm and dry, no rash Neuro:  Strength and sensation are intact Psych: euthymic mood,  full affect   EKG:  EKG is ordered today. The ekg ordered today demonstrates NSR with non specific TW flattenening. TWI in lead III   Recent Labs: 03/08/2014: ALT 35    Lipid Panel    Component Value Date/Time   CHOL 156 03/08/2014 0826   TRIG 149.0 03/08/2014 0826   HDL 47.80 03/08/2014 0826   CHOLHDL 3 03/08/2014 0826   VLDL 29.8 03/08/2014 0826   LDLCALC 78 03/08/2014 0826   LDLDIRECT 139.6 01/11/2014 0851      Wt Readings from Last 3 Encounters:  02/08/15 203 lb 3.2 oz (92.171 kg)  01/05/14 207 lb (93.895 kg)  01/12/13 202 lb (91.627 kg)      Other studies Reviewed: Additional studies/ records that were reviewed  today include: 2D ECHO Review of the above records demonstrates:   2D ECHO (2010) mild LVH, normal systolic and diastolic function.    ASSESSMENT AND PLAN:  Leroy Leonard is a 44 y.o. male with a history of HTN, HLD and SVT who presents to clinic for 1 year follow up.   HTN: BP 132/78 today. Well controlled on Norvasc 10mg , Prinzide 20-25mg   and Toprol XL 100mg  qd.   SVT: well controlled on Toprol XL 100mg .   HLD: continue 1/2 tab Crestor every other day. Will repeat LFTs and fasting lipid panel today.    Current medicines are reviewed at length with the patient today.  The patient does not have concerns regarding medicines.  The following changes have been made:  no change  Labs/ tests ordered today include:  Orders Placed This Encounter  Procedures  . Lipid panel  . Hepatic function panel  . EKG 12-Lead     Disposition:   FU with Dr. Delton SeeNelson in 1 year  Signed, Allena KatzHOMPSON, Angellina Ferdinand R, PA-C  02/08/2015 8:33 AM    [facility] [address], [place], [place]  [address] Phone: [phone]; Fax: [phone]

## 2015-02-08 ENCOUNTER — Encounter: Payer: Self-pay | Admitting: Physician Assistant

## 2015-02-08 ENCOUNTER — Other Ambulatory Visit: Payer: Federal, State, Local not specified - PPO

## 2015-02-08 ENCOUNTER — Ambulatory Visit (INDEPENDENT_AMBULATORY_CARE_PROVIDER_SITE_OTHER): Payer: Federal, State, Local not specified - PPO | Admitting: Physician Assistant

## 2015-02-08 VITALS — BP 132/78 | HR 82 | Ht 70.0 in | Wt 203.2 lb

## 2015-02-08 DIAGNOSIS — R002 Palpitations: Secondary | ICD-10-CM | POA: Diagnosis not present

## 2015-02-08 DIAGNOSIS — E785 Hyperlipidemia, unspecified: Secondary | ICD-10-CM

## 2015-02-08 DIAGNOSIS — I119 Hypertensive heart disease without heart failure: Secondary | ICD-10-CM

## 2015-02-08 LAB — LIPID PANEL
Cholesterol: 224 mg/dL — ABNORMAL HIGH (ref 125–200)
HDL: 49 mg/dL (ref >=40)
LDL CALC: 131 mg/dL — AB (ref <130)
TRIGLYCERIDES: 222 mg/dL — AB (ref <150)
Total CHOL/HDL Ratio: 4.6 Ratio (ref <=5.0)
VLDL: 44 mg/dL — AB (ref <30)

## 2015-02-08 LAB — HEPATIC FUNCTION PANEL
ALBUMIN: 4.7 g/dL (ref 3.6–5.1)
ALT: 26 U/L (ref 9–46)
AST: 20 U/L (ref 10–40)
Alkaline Phosphatase: 57 U/L (ref 40–115)
BILIRUBIN TOTAL: 0.8 mg/dL (ref 0.2–1.2)
Bilirubin, Direct: 0.2 mg/dL (ref <=0.2)
Indirect Bilirubin: 0.6 mg/dL (ref 0.2–1.2)
TOTAL PROTEIN: 7.8 g/dL (ref 6.1–8.1)

## 2015-02-08 MED ORDER — ROSUVASTATIN CALCIUM 10 MG PO TABS: 5.0000 mg | ORAL_TABLET | ORAL | Status: DC

## 2015-02-08 NOTE — Patient Instructions (Signed)
Medication Instructions:  Your physician recommends that you continue on your current medications as directed. Please refer to the Current Medication list given to you today.   Labwork: TODAY:  LIPIDS & HEPATIC FUNCTION   Testing/Procedures: NONE ORDERED  Follow-Up: Your physician wants you to follow-up in: 1 YEAR WITH DR. Delton SeeNELSON.  You will receive a reminder letter in the mail two months in advance. If you don't receive a letter, please call our office to schedule the follow-up appointment.   Any Other Special Instructions Will Be Listed Below (If Applicable).     If you need a refill on your cardiac medications before your next appointment, please call your pharmacy.

## 2015-02-09 ENCOUNTER — Telehealth: Payer: Self-pay | Admitting: *Deleted

## 2015-02-09 DIAGNOSIS — E785 Hyperlipidemia, unspecified: Secondary | ICD-10-CM

## 2015-02-09 MED ORDER — ROSUVASTATIN CALCIUM 10 MG PO TABS: 10.0000 mg | ORAL_TABLET | ORAL | Status: DC

## 2015-02-09 NOTE — Telephone Encounter (Signed)
Called pt re: his lab results.  He is aware that his LFTs are normal, however, his Lipids are not at goal and we want to increase him taking 1 whole Crestor every other day instead 1/2 pill.  Pt agreed and verbalized understanding.  A new rx has been sent into the pt's pharmacy.

## 2015-02-09 NOTE — Telephone Encounter (Signed)
-----   Message from Janetta HoraKathryn R Thompson, PA-C sent at 02/08/2015  4:40 PM EST ----- LFTs look good. Lipids are not at goal. Would increase crestor to 1 whole pill every other day if he can tolerate this. Also diet and exercise to help lower further.

## 2015-05-01 ENCOUNTER — Other Ambulatory Visit: Payer: Self-pay | Admitting: Cardiology

## 2015-05-02 ENCOUNTER — Other Ambulatory Visit: Payer: Self-pay

## 2015-05-02 MED ORDER — AMLODIPINE BESYLATE 10 MG PO TABS
10.0000 mg | ORAL_TABLET | Freq: Every day | ORAL | Status: DC
Start: 2015-05-02 — End: 2016-02-06

## 2016-02-06 ENCOUNTER — Other Ambulatory Visit: Payer: Self-pay | Admitting: *Deleted

## 2016-02-06 MED ORDER — LISINOPRIL-HYDROCHLOROTHIAZIDE 20-25 MG PO TABS: 1.0000 | ORAL_TABLET | Freq: Every day | ORAL | 0 refills | Status: DC

## 2016-02-06 MED ORDER — METOPROLOL SUCCINATE ER 100 MG PO TB24: ORAL_TABLET | ORAL | 0 refills | Status: DC

## 2016-02-06 MED ORDER — AMLODIPINE BESYLATE 10 MG PO TABS: 10.0000 mg | ORAL_TABLET | Freq: Every day | ORAL | 0 refills | Status: DC

## 2016-03-13 ENCOUNTER — Other Ambulatory Visit: Payer: Self-pay | Admitting: Physician Assistant

## 2016-04-11 ENCOUNTER — Other Ambulatory Visit: Payer: Self-pay | Admitting: Physician Assistant

## 2016-04-13 ENCOUNTER — Encounter: Payer: Self-pay | Admitting: Cardiology

## 2016-04-13 ENCOUNTER — Ambulatory Visit (INDEPENDENT_AMBULATORY_CARE_PROVIDER_SITE_OTHER): Payer: Federal, State, Local not specified - PPO | Admitting: Cardiology

## 2016-04-13 VITALS — BP 124/74 | HR 88 | Ht 70.0 in | Wt 189.0 lb

## 2016-04-13 DIAGNOSIS — Z8249 Family history of ischemic heart disease and other diseases of the circulatory system: Secondary | ICD-10-CM | POA: Diagnosis not present

## 2016-04-13 DIAGNOSIS — I471 Supraventricular tachycardia: Secondary | ICD-10-CM | POA: Diagnosis not present

## 2016-04-13 DIAGNOSIS — I119 Hypertensive heart disease without heart failure: Secondary | ICD-10-CM | POA: Diagnosis not present

## 2016-04-13 DIAGNOSIS — E782 Mixed hyperlipidemia: Secondary | ICD-10-CM

## 2016-04-13 LAB — CBC WITH DIFFERENTIAL/PLATELET
Basophils Absolute: 0 10*3/uL (ref 0.0–0.2)
Basos: 0 %
EOS (ABSOLUTE): 0.2 10*3/uL (ref 0.0–0.4)
Eos: 3 %
Hematocrit: 46.3 % (ref 37.5–51.0)
Hemoglobin: 16 g/dL (ref 13.0–17.7)
Immature Grans (Abs): 0 10*3/uL (ref 0.0–0.1)
Immature Granulocytes: 0 %
Lymphocytes Absolute: 2.6 10*3/uL (ref 0.7–3.1)
Lymphs: 42 %
MCH: 30.5 pg (ref 26.6–33.0)
MCHC: 34.6 g/dL (ref 31.5–35.7)
MCV: 88 fL (ref 79–97)
Monocytes Absolute: 0.5 10*3/uL (ref 0.1–0.9)
Monocytes: 7 %
Neutrophils Absolute: 3 10*3/uL (ref 1.4–7.0)
Neutrophils: 48 %
Platelets: 306 10*3/uL (ref 150–379)
RBC: 5.25 x10E6/uL (ref 4.14–5.80)
RDW: 12.6 % (ref 12.3–15.4)
WBC: 6.2 10*3/uL (ref 3.4–10.8)

## 2016-04-13 LAB — COMPREHENSIVE METABOLIC PANEL
ALT: 27 IU/L (ref 0–44)
AST: 19 IU/L (ref 0–40)
Albumin/Globulin Ratio: 1.7 (ref 1.2–2.2)
Albumin: 4.7 g/dL (ref 3.5–5.5)
Alkaline Phosphatase: 59 IU/L (ref 39–117)
BUN/Creatinine Ratio: 13 (ref 9–20)
BUN: 11 mg/dL (ref 6–24)
Bilirubin Total: 0.5 mg/dL (ref 0.0–1.2)
CO2: 22 mmol/L (ref 18–29)
Calcium: 10.2 mg/dL (ref 8.7–10.2)
Chloride: 99 mmol/L (ref 96–106)
Creatinine, Ser: 0.86 mg/dL (ref 0.76–1.27)
GFR calc Af Amer: 122 mL/min/{1.73_m2} (ref >59)
GFR calc non Af Amer: 105 mL/min/{1.73_m2} (ref >59)
Globulin, Total: 2.7 g/dL (ref 1.5–4.5)
Glucose: 92 mg/dL (ref 65–99)
Potassium: 4.4 mmol/L (ref 3.5–5.2)
Sodium: 139 mmol/L (ref 134–144)
Total Protein: 7.4 g/dL (ref 6.0–8.5)

## 2016-04-13 LAB — LIPID PANEL
Chol/HDL Ratio: 2.6 ratio units (ref 0.0–5.0)
Cholesterol, Total: 172 mg/dL (ref 100–199)
HDL: 65 mg/dL (ref >39)
LDL Calculated: 80 mg/dL (ref 0–99)
Triglycerides: 136 mg/dL (ref 0–149)
VLDL Cholesterol Cal: 27 mg/dL (ref 5–40)

## 2016-04-13 LAB — TSH: TSH: 1.68 u[IU]/mL (ref 0.450–4.500)

## 2016-04-13 MED ORDER — LISINOPRIL-HYDROCHLOROTHIAZIDE 20-25 MG PO TABS: 1.0000 | ORAL_TABLET | Freq: Every day | ORAL | 3 refills | Status: DC

## 2016-04-13 MED ORDER — AMLODIPINE BESYLATE 10 MG PO TABS: 10.0000 mg | ORAL_TABLET | Freq: Every day | ORAL | 3 refills | Status: DC

## 2016-04-13 MED ORDER — METOPROLOL SUCCINATE ER 100 MG PO TB24: 100.0000 mg | ORAL_TABLET | Freq: Every day | ORAL | 3 refills | Status: DC

## 2016-04-13 MED ORDER — ROSUVASTATIN CALCIUM 10 MG PO TABS: 10.0000 mg | ORAL_TABLET | ORAL | 3 refills | Status: DC

## 2016-04-13 NOTE — Patient Instructions (Signed)
Medication Instructions:  Your physician recommends that you continue on your current medications as directed. Please refer to the Current Medication list given to you today.   Labwork: TODAY - TSH, CBC, complete metabolic panel, cholesterol   Testing/Procedures: None Ordered   Follow-Up: Your physician wants you to follow-up in: 1 year with Dr. Delton SeeNelson.  You will receive a reminder letter in the mail two months in advance. If you don't receive a letter, please call our office to schedule the follow-up appointment.   If you need a refill on your cardiac medications before your next appointment, please call your pharmacy.   Thank you for choosing [facility]! Eligha BridegroomMichelle Carynn Felling, RN [phone]

## 2016-04-13 NOTE — Progress Notes (Signed)
Cardiology Office Note   Date:  04/13/2016   ID:  Leroy Leonard, DOB 30-Mar-1970, MRN [id number]  PCP:  Stephens Shire, MD  Cardiologist:  Dr. Mare Ferrari --> Dr. Meda Coffee  1 year follow up   History of Present Illness: Leroy Leonard is a 46 y.o. male with a history of HTN, HLD and SVT who presents to clinic for 1 year follow up.   2D ECHO (2010) mild LVH, normal systolic and diastolic function. He was seen by Dr. Mare Ferrari last year for yearly follow up and doing well from a cardiac standpoint with no symptoms. He was very active doing moderate exercise and weight training at home and riding bikes with his wife. He has a history of intolerance to Lipitor. He tolerated Crestor but it was too expensive. He was up 5 lbs and Dr. Mare Ferrari recommended weight loss. Fasting lipid/hepatic panel done in 02/2014. LDL 78. LFTs normal.   Today he presents to clinic for follow up. No CP, SOB or palpitations. He walks the dog everyday. 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 He has no exertional symptoms. No LE edema, orthopnea or PND. He has been feeling quite well and has no complaints.  He has a strong family history of CAD. Maternal GF and maternal aunt had heart disease in 73s and 15s.   04/13/2016 - this is one year follow-up, patient continues to be very active, exercises several times a week and denies any chest pain shortness of breath palpitations or syncope. He also has no edema or orthopnea. He has been compliant with his medications and has no side effects. He he has lost 15 pounds since last year is he's trying to eat healthier.    Past Medical History:  Diagnosis Date  . HTN (hypertension)   . SVT (supraventricular tachycardia) (HCC)     Past Surgical History:  Procedure Laterality Date  . CYSTECTOMY     leg     Current Outpatient Prescriptions  Medication Sig Dispense Refill  . amLODipine (NORVASC) 10 MG tablet Take 1 tablet (10  mg total) by mouth daily. 90 tablet 3  . lisinopril-hydrochlorothiazide (PRINZIDE,ZESTORETIC) 20-25 MG tablet Take 1 tablet by mouth daily. 90 tablet 3  . metoprolol succinate (TOPROL-XL) 100 MG 24 hr tablet Take 1 tablet (100 mg total) by mouth daily. Take with or immediately following a meal. 90 tablet 3  . Multiple Vitamin (MULTIVITAMIN) tablet Take 1 tablet by mouth daily.    . rosuvastatin (CRESTOR) 10 MG tablet Take 1 tablet (10 mg total) by mouth every other day. 90 tablet 3   No current facility-administered medications for this visit.    Allergies:   Lipitor [atorvastatin]   Social History:  The patient  reports that he has never smoked. He has never used smokeless tobacco. He reports that he drinks about 2.4 oz of alcohol per week .   Family History:  The patient's family history includes COPD in his father; Heart attack in his maternal grandfather; Hypertension in his father and mother.   ROS:  Please see the history of present illness.   Otherwise, review of systems are positive for NONE.   All other systems are reviewed and negative.   PHYSICAL EXAM: VS:  BP 124/74   Pulse 88   Ht '5\' 10"'$  (1.778 m)   Wt 189 lb (85.7 kg)   BMI 27.12 kg/m  , BMI Body mass index is 27.12 kg/m. GEN: Well  nourished, well developed, in no acute distress HEENT: normal Neck: no JVD, carotid bruits, or masses Cardiac: RRR; no murmurs, rubs, or gallops,no edema  Respiratory:  clear to auscultation bilaterally, normal work of breathing GI: soft, nontender, nondistended, + BS MS: no deformity or atrophy Skin: warm and dry, no rash Neuro:  Strength and sensation are intact Psych: euthymic mood, full affect   EKG:  EKG is ordered today. The ekg ordered today demonstrates NSR with non specific TW flattenening. TWI in lead III   Recent Labs: No results found for requested labs within last 8760 hours.    Lipid Panel    Component Value Date/Time   CHOL 224 (H) 02/08/2015 0826   TRIG 222 (H)  02/08/2015 0826   HDL 49 02/08/2015 0826   CHOLHDL 4.6 02/08/2015 0826   VLDL 44 (H) 02/08/2015 0826   LDLCALC 131 (H) 02/08/2015 0826   LDLDIRECT 139.6 01/11/2014 0851      Wt Readings from Last 3 Encounters:  04/13/16 189 lb (85.7 kg)  02/08/15 203 lb 3.2 oz (92.2 kg)  01/05/14 207 lb (93.9 kg)      Other studies Reviewed: Additional studies/ records that were reviewed today include: 2D ECHO Review of the above records demonstrates:   2D ECHO (2010) mild LVH, normal systolic and diastolic function.    ASSESSMENT AND PLAN:  Leroy Leonard is a 46 y.o. male with a history of HTN, HLD and SVT who presents to clinic for 1 year follow up.   [id number] on current regimen and will continue.   SVT: well controlled on Toprol XL '100mg'$ . No further episodes.  HLD: Last year lipids elevated, he has lost 15 pounds since then and has been strict with his diet. We will repeat lipids and LFTs today.    Current medicines are reviewed at length with the patient today.  The patient does not have concerns regarding medicines.  The following changes have been made:  no change  Labs/ tests ordered today include:  Orders Placed This Encounter  Procedures  . CBC w/Diff  . Comp Met (CMET)  . TSH  . Lipid Profile  . EKG 12-Lead     Disposition:   FU with Dr. Meda Coffee in 1 year  Signed, Ena Dawley, MD  04/13/2016 9:03 AM    [place], [place], [place]  [address] Phone: [phone]; Fax: [phone]

## 2017-04-01 ENCOUNTER — Other Ambulatory Visit: Payer: Self-pay | Admitting: Cardiology

## 2017-04-02 ENCOUNTER — Other Ambulatory Visit: Payer: Self-pay | Admitting: Cardiology

## 2017-04-19 ENCOUNTER — Ambulatory Visit: Payer: Federal, State, Local not specified - PPO | Admitting: Cardiology

## 2017-04-19 ENCOUNTER — Encounter: Payer: Self-pay | Admitting: Cardiology

## 2017-04-19 VITALS — BP 130/84 | HR 80 | Ht 70.0 in | Wt 201.0 lb

## 2017-04-19 DIAGNOSIS — I119 Hypertensive heart disease without heart failure: Secondary | ICD-10-CM

## 2017-04-19 DIAGNOSIS — E785 Hyperlipidemia, unspecified: Secondary | ICD-10-CM

## 2017-04-19 DIAGNOSIS — I471 Supraventricular tachycardia: Secondary | ICD-10-CM

## 2017-04-19 DIAGNOSIS — Z8249 Family history of ischemic heart disease and other diseases of the circulatory system: Secondary | ICD-10-CM

## 2017-04-19 NOTE — Patient Instructions (Signed)
Medication Instructions:   Your physician recommends that you continue on your current medications as directed. Please refer to the Current Medication list given to you today.    Labwork:  TODAY--CMET, CBC W DIFF, TSH, AND LIPIDS     Follow-Up:  Your physician wants you to follow-up in: ONE YEAR WITH DR NELSON You will receive a reminder letter in the mail two months in advance. If you don't receive a letter, please call our office to schedule the follow-up appointment.        If you need a refill on your cardiac medications before your next appointment, please call your pharmacy.   

## 2017-04-19 NOTE — Progress Notes (Signed)
Cardiology Office Note   Date:  04/19/2017   ID:  Leroy Leonard, DOB [birth year], MRN [id number]  PCP:  Stephens Shire, MD  Cardiologist:  Dr. Mare Ferrari --> Dr. Meda Coffee  1 year follow up   History of Present Illness: Leroy Leonard is a 47 y.o. male with a history of HTN, HLD and SVT who presents to clinic for 1 year follow up.   2D ECHO (2010) mild LVH, normal systolic and diastolic function. He was seen by Dr. Mare Ferrari last year for yearly follow up and doing well from a cardiac standpoint with no symptoms. He was very active doing moderate exercise and weight training at home and riding bikes with his wife. He has a history of intolerance to Lipitor. He tolerated Crestor but it was too expensive. He was up 5 lbs and Dr. Mare Ferrari recommended weight loss. Fasting lipid/hepatic panel done in 02/2014. LDL 78. LFTs normal.   Today he presents to clinic for follow up. No CP, SOB or palpitations. He walks the dog everyday. 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 He has no exertional symptoms. No LE edema, orthopnea or PND. He has been feeling quite well and has no complaints.  He has a strong family history of CAD. Maternal GF and maternal aunt had heart disease in 36s and 55s.   04/13/2016 - this is one year follow-up, patient continues to be very active, exercises several times a week and denies any chest pain shortness of breath palpitations or syncope. He also has no edema or orthopnea. He has been compliant with his medications and has no side effects. He he has lost 15 pounds since last year is he's trying to eat healthier.   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 With that he has been having no chest pain shortness of breath, no palpitations dizziness no syncope. No claudication or lower extremity edema. He  has been compliant with his medication and is tolerating Crestor well.   Past Medical History:  Diagnosis Date  . HTN (hypertension)   . SVT (supraventricular tachycardia) (HCC)     Past Surgical History:  Procedure Laterality Date  . CYSTECTOMY     leg     Current Outpatient Medications  Medication Sig Dispense Refill  . amLODipine (NORVASC) 10 MG tablet TAKE 1 TABLET(10 MG) BY MOUTH DAILY 30 tablet 0  . lisinopril-hydrochlorothiazide (PRINZIDE,ZESTORETIC) 20-25 MG tablet Take 1 tablet by mouth daily. Patient needs to call and schedule an appointment for further refills 1st attempt 30 tablet 0  . metoprolol succinate (TOPROL-XL) 100 MG 24 hr tablet Take 1 tablet by mouth daily with or immediately following a meal.Patient needs to call and schedule an appt for further refills 1st attempt 30 tablet 0  . Multiple Vitamin (MULTIVITAMIN) tablet Take 1 tablet by mouth daily.    . rosuvastatin (CRESTOR) 10 MG tablet Take 1 tablet (10 mg total) by mouth every other day. 90 tablet 3   No current facility-administered medications for this visit.    Allergies:   Lipitor [atorvastatin]   Social History:  The patient  reports that  has never smoked. he has never used smokeless tobacco. He reports that he drinks about 2.4 oz of alcohol per week.   Family History:  The patient's family history includes  COPD in his father; Heart attack in his maternal grandfather; Hypertension in his father and mother.   ROS:  Please see the history of present illness.   Otherwise, review of systems are positive for NONE.   All other systems are reviewed and negative.   PHYSICAL EXAM: VS:  BP 130/84   Pulse 80   Ht 5' 10" (1.778 m)   Wt 201 lb (91.2 kg)   BMI 28.84 kg/m  , BMI Body mass index is 28.84 kg/m. GEN: Well nourished, well developed, in no acute distress HEENT: normal Neck: no JVD, carotid bruits, or masses Cardiac: RRR; no murmurs, rubs, or gallops,no edema  Respiratory:  clear to  auscultation bilaterally, normal work of breathing GI: soft, nontender, nondistended, + BS MS: no deformity or atrophy Skin: warm and dry, no rash Neuro:  Strength and sensation are intact Psych: euthymic mood, full affect   EKG:  EKG is ordered today. The ekg ordered today demonstrates NSR, normal EKG and unchanged from prior. This was personally reviewed.   Recent Labs: No results found for requested labs within last 8760 hours.    Lipid Panel    Component Value Date/Time   CHOL 172 04/13/2016 0859   TRIG 136 04/13/2016 0859   HDL 65 04/13/2016 0859   CHOLHDL 2.6 04/13/2016 0859   CHOLHDL 4.6 02/08/2015 0826   VLDL 44 (H) 02/08/2015 0826   LDLCALC 80 04/13/2016 0859   LDLDIRECT 139.6 01/11/2014 0851      Wt Readings from Last 3 Encounters:  04/19/17 201 lb (91.2 kg)  04/13/16 189 lb (85.7 kg)  02/08/15 203 lb 3.2 oz (92.2 kg)      Other studies Reviewed: Additional studies/ records that were reviewed today include: 2D ECHO Review of the above records demonstrates:   2D ECHO (2010) mild LVH, normal systolic and diastolic function.    ASSESSMENT AND PLAN:  BSJ:[id number] on current regimen and will continue.   SVT: well controlled on Toprol XL 195m. No further episodes.  HLD: Last year lipids elevated, he has lost 15 pounds since then and has been strict with his diet. We will repeat lipids and LFTs today. If he is cholesterol is well controlled I would decrease dose of rosuvastatin to 3 times a week.  We will obtain CMP, lipids, CBC, TSH today. Follow-up in one year.   Current medicines are reviewed at length with the patient today.  The patient does not have concerns regarding medicines.  The following changes have been made:  no change  Labs/ tests ordered today include:  Orders Placed This Encounter  Procedures  . Comp Met (CMET)  . CBC w/Diff  . TSH  . Lipid Profile  . EKG 12-Lead     Disposition:   FU with Dr. NMeda Coffeein 1  year  Signed, KEna Dawley MD  04/19/2017 9:25 AM    [address] [place]  [id number]: [phone] Fax: [phone]

## 2017-04-20 LAB — CBC WITH DIFFERENTIAL/PLATELET
Basophils Absolute: 0 10*3/uL (ref 0.0–0.2)
Basos: 0 %
EOS (ABSOLUTE): 0.3 10*3/uL (ref 0.0–0.4)
Eos: 3 %
Hematocrit: 44.5 % (ref 37.5–51.0)
Hemoglobin: 16.2 g/dL (ref 13.0–17.7)
Immature Grans (Abs): 0 10*3/uL (ref 0.0–0.1)
Immature Granulocytes: 0 %
Lymphocytes Absolute: 3.5 10*3/uL — ABNORMAL HIGH (ref 0.7–3.1)
Lymphs: 45 %
MCH: 31.7 pg (ref 26.6–33.0)
MCHC: 36.4 g/dL — ABNORMAL HIGH (ref 31.5–35.7)
MCV: 87 fL (ref 79–97)
Monocytes Absolute: 0.4 10*3/uL (ref 0.1–0.9)
Monocytes: 5 %
Neutrophils Absolute: 3.6 10*3/uL (ref 1.4–7.0)
Neutrophils: 47 %
Platelets: 324 10*3/uL (ref 150–379)
RBC: 5.11 x10E6/uL (ref 4.14–5.80)
RDW: 12.7 % (ref 12.3–15.4)
WBC: 7.7 10*3/uL (ref 3.4–10.8)

## 2017-04-20 LAB — COMPREHENSIVE METABOLIC PANEL
ALT: 25 IU/L (ref 0–44)
AST: 21 IU/L (ref 0–40)
Albumin/Globulin Ratio: 1.9 (ref 1.2–2.2)
Albumin: 5 g/dL (ref 3.5–5.5)
Alkaline Phosphatase: 62 IU/L (ref 39–117)
BUN/Creatinine Ratio: 11 (ref 9–20)
BUN: 9 mg/dL (ref 6–24)
Bilirubin Total: 0.4 mg/dL (ref 0.0–1.2)
CO2: 23 mmol/L (ref 20–29)
Calcium: 9.9 mg/dL (ref 8.7–10.2)
Chloride: 98 mmol/L (ref 96–106)
Creatinine, Ser: 0.82 mg/dL (ref 0.76–1.27)
GFR calc Af Amer: 123 mL/min/{1.73_m2} (ref >59)
GFR calc non Af Amer: 107 mL/min/{1.73_m2} (ref >59)
Globulin, Total: 2.6 g/dL (ref 1.5–4.5)
Glucose: 96 mg/dL (ref 65–99)
Potassium: 3.9 mmol/L (ref 3.5–5.2)
Sodium: 137 mmol/L (ref 134–144)
Total Protein: 7.6 g/dL (ref 6.0–8.5)

## 2017-04-20 LAB — LIPID PANEL
Chol/HDL Ratio: 3.2 ratio (ref 0.0–5.0)
Cholesterol, Total: 152 mg/dL (ref 100–199)
HDL: 47 mg/dL (ref >39)
LDL Calculated: 67 mg/dL (ref 0–99)
Triglycerides: 190 mg/dL — ABNORMAL HIGH (ref 0–149)
VLDL Cholesterol Cal: 38 mg/dL (ref 5–40)

## 2017-04-20 LAB — TSH: TSH: 1.42 u[IU]/mL (ref 0.450–4.500)

## 2017-05-02 ENCOUNTER — Other Ambulatory Visit: Payer: Self-pay | Admitting: Cardiology

## 2017-07-01 ENCOUNTER — Other Ambulatory Visit: Payer: Self-pay | Admitting: Cardiology

## 2017-07-01 DIAGNOSIS — E782 Mixed hyperlipidemia: Secondary | ICD-10-CM

## 2018-03-26 ENCOUNTER — Ambulatory Visit: Payer: Federal, State, Local not specified - PPO | Admitting: Cardiology

## 2018-03-26 ENCOUNTER — Encounter: Payer: Self-pay | Admitting: Cardiology

## 2018-03-26 ENCOUNTER — Ambulatory Visit
Admission: RE | Admit: 2018-03-26 | Discharge: 2018-03-26 | Disposition: A | Discharge status: Home / Self Care | Payer: Self-pay | Source: Ambulatory Visit | Attending: Cardiology | Admitting: Cardiology

## 2018-03-26 VITALS — BP 130/82 | HR 65 | Ht 70.0 in | Wt 195.4 lb

## 2018-03-26 DIAGNOSIS — E782 Mixed hyperlipidemia: Secondary | ICD-10-CM

## 2018-03-26 DIAGNOSIS — E785 Hyperlipidemia, unspecified: Secondary | ICD-10-CM | POA: Diagnosis not present

## 2018-03-26 DIAGNOSIS — I119 Hypertensive heart disease without heart failure: Secondary | ICD-10-CM

## 2018-03-26 DIAGNOSIS — Z8249 Family history of ischemic heart disease and other diseases of the circulatory system: Secondary | ICD-10-CM

## 2018-03-26 NOTE — Patient Instructions (Signed)
Medication Instructions:   Your physician recommends that you continue on your current medications as directed. Please refer to the Current Medication list given to you today.  If you need a refill on your cardiac medications before your next appointment, please call your pharmacy.    Lab work:  TODAY--CMET, CBC W DIFF, TSH, APOLIPOPROTEIN B, LIPOPROTEIN A, AND NMR LIPOPROFILE  If you have labs (blood work) drawn today and your tests are completely normal, you will receive your results only by: Marland Kitchen MyChart Message (if you have MyChart) OR . A paper copy in the mail If you have any lab test that is abnormal or we need to change your treatment, we will call you to review the results.    Testing/Procedures:  CARDIAC CALCIUM SCORE TO BE DONE TODAY AT OUR OFFICE--OKAYED BY STACEY SNEAD IN CT DEPARTMENT    Follow-Up: At [facility], you and your health needs are our priority.  As part of our continuing mission to provide you with exceptional heart care, we have created designated Provider Care Teams.  These Care Teams include your primary Cardiologist (physician) and Advanced Practice Providers (APPs -  Physician Assistants and Nurse Practitioners) who all work together to provide you with the care you need, when you need it. You will need a follow up appointment in 1 YEAR.  Please call our office 2 months in advance to schedule this appointment.  You may see Tobias Alexander, MD or one of the following Advanced Practice Providers on your designated Care Team:   [place], PA-C Ronie Spies, PA-C . Jacolyn Reedy, PA-C

## 2018-03-26 NOTE — Progress Notes (Signed)
Cardiology Office Note   Date:  03/26/2018   ID:  Leroy Leonard, DOB 1970-08-27, MRN [id number]  PCP:  Leroy Lek, MD  Cardiologist:  Dr. Patty Leonard --> Dr. Delton Leonard  1 year follow up   History of Present Illness: Leroy Leonard is a 49 y.o. male with a history of HTN, HLD and SVT who presents to clinic for 1 year follow up.   2D ECHO (2010) mild LVH, normal systolic and diastolic function. He was seen by Dr. Patty Leonard last year for yearly follow up and doing well from a cardiac standpoint with no symptoms. He was very active doing moderate exercise and weight training at home and riding bikes with his wife. He has a history of intolerance to Lipitor. He tolerated Crestor but it was too expensive. He was up 5 lbs and Dr. Patty Leonard recommended weight loss. Fasting lipid/hepatic panel done in 02/2014. LDL 78. LFTs normal.   He has a strong family history of CAD. Maternal GF and maternal aunt had heart disease in 39s and 51s.   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 With that he has been having no chest pain shortness of breath, no palpitations dizziness no syncope. No claudication or lower extremity edema. He has been compliant with his medication and is tolerating Crestor well.  03/26/2018 -the patient is coming after 1 year, he continues to exercise several times a week and has completely changed his diet, now eating very healthy plant-based diet, he denies any chest pain shortness of breath, no palpitation lower extremity edema or claudications.  Past Medical History:  Diagnosis Date  . HTN (hypertension)   . SVT (supraventricular tachycardia) (HCC)     Past Surgical History:  Procedure Laterality Date  . CYSTECTOMY     leg     Current Outpatient Medications  Medication Sig Dispense Refill  . amLODipine (NORVASC) 10 MG tablet TAKE 1 TABLET(10 MG) BY  MOUTH DAILY 90 tablet 3  . lisinopril-hydrochlorothiazide (PRINZIDE,ZESTORETIC) 20-25 MG tablet TAKE 1 TABLET BY MOUTH DAILY 90 tablet 3  . metoprolol succinate (TOPROL-XL) 100 MG 24 hr tablet TAKE 1 TABLET BY MOUTH EVERY DAY WITH OR IMMEDIATELY FOLLOWING A MEAL. 90 tablet 3  . Multiple Vitamin (MULTIVITAMIN) tablet Take 1 tablet by mouth daily.    . rosuvastatin (CRESTOR) 10 MG tablet TAKE 1 TABLET BY MOUTH EVERY OTHER DAY 90 tablet 3   No current facility-administered medications for this visit.    Allergies:   Lipitor [atorvastatin]   Social History:  The patient  reports that he has never smoked. He has never used smokeless tobacco. He reports current alcohol use of about 4.0 standard drinks of alcohol per week.   Family History:  The patient's family history includes COPD in his father; Heart attack in his maternal grandfather; Hypertension in his father and mother.   ROS:  Please Leonard the history of present illness.   Otherwise, review of systems are positive for NONE.   All other systems are reviewed and negative.   PHYSICAL EXAM: VS:  BP 130/82   Pulse 65   Ht 5\' 10"  (1.778 m)   Wt 195 lb 6.4 oz (88.6 kg)   SpO2 98%   BMI 28.04 kg/m  , BMI Body mass index is 28.04 kg/m. GEN: Well nourished, well developed, in no acute distress HEENT: normal  Neck: no JVD, carotid bruits, or masses Cardiac: RRR; no murmurs, rubs, or gallops,no edema  Respiratory:  clear to auscultation bilaterally, normal work of breathing GI: soft, nontender, nondistended, + BS MS: no deformity or atrophy Skin: warm and dry, no rash Neuro:  Strength and sensation are intact Psych: euthymic mood, full affect   EKG:  EKG is ordered today. The ekg ordered today demonstrates NSR, normal EKG and unchanged from prior. This was personally reviewed.   Recent Labs: 04/19/2017: ALT 25; BUN 9; Creatinine, Ser 0.82; Hemoglobin 16.2; Platelets 324; Potassium 3.9; Sodium 137; TSH 1.420    Lipid Panel    Component  Value Date/Time   CHOL 152 04/19/2017 0925   TRIG 190 (H) 04/19/2017 0925   HDL 47 04/19/2017 0925   CHOLHDL 3.2 04/19/2017 0925   CHOLHDL 4.6 02/08/2015 0826   VLDL 44 (H) 02/08/2015 0826   LDLCALC 67 04/19/2017 0925   LDLDIRECT 139.6 01/11/2014 0851      Wt Readings from Last 3 Encounters:  03/26/18 195 lb 6.4 oz (88.6 kg)  04/19/17 201 lb (91.2 kg)  04/13/16 189 lb (85.7 kg)      Other studies Reviewed: Additional studies/ records that were reviewed today include: 2D ECHO Review of the above records demonstrates:   2D ECHO (2010) mild LVH, normal systolic and diastolic function.    ASSESSMENT AND PLAN:  Leroy Leonard on current regimen and will continue.   SVT: well controlled on Toprol XL 100mg . No further episodes.  HLD: We will obtain NMR lipids today along with calcium scoring and will decide if any therapy is needed at this point. The patient has very strong family history of coronary artery disease so the threshold for starting statins will be low.  We will obtain CMP, NMR lipids, CBC, TSH today. Follow-up in one year.   Current medicines are reviewed at length with the patient today.  The patient does not have concerns regarding medicines.  The following changes have been made:  no change  Labs/ tests ordered today include:  No orders of the defined types were placed in this encounter.    Disposition:   FU with Dr. Delton Leonard in 1 year  Signed, Leroy Alexander, MD  03/26/2018 8:50 AM    [facility] [address] [place], [place], [place]  [address] Phone: [phone]; Fax: [phone]

## 2018-03-27 LAB — CBC WITH DIFFERENTIAL/PLATELET
Basophils Absolute: 0 10*3/uL (ref 0.0–0.2)
Basos: 1 %
EOS (ABSOLUTE): 0.3 10*3/uL (ref 0.0–0.4)
Eos: 5 %
Hematocrit: 48.8 % (ref 37.5–51.0)
Hemoglobin: 16.5 g/dL (ref 13.0–17.7)
Immature Grans (Abs): 0 10*3/uL (ref 0.0–0.1)
Immature Granulocytes: 0 %
Lymphocytes Absolute: 2 10*3/uL (ref 0.7–3.1)
Lymphs: 32 %
MCH: 30.6 pg (ref 26.6–33.0)
MCHC: 33.8 g/dL (ref 31.5–35.7)
MCV: 90 fL (ref 79–97)
Monocytes Absolute: 0.4 10*3/uL (ref 0.1–0.9)
Monocytes: 7 %
Neutrophils Absolute: 3.6 10*3/uL (ref 1.4–7.0)
Neutrophils: 55 %
Platelets: 245 10*3/uL (ref 150–450)
RBC: 5.4 x10E6/uL (ref 4.14–5.80)
RDW: 12.3 % (ref 11.6–15.4)
WBC: 6.4 10*3/uL (ref 3.4–10.8)

## 2018-03-27 LAB — TSH: TSH: 1.42 u[IU]/mL (ref 0.450–4.500)

## 2018-03-27 LAB — COMPREHENSIVE METABOLIC PANEL
ALT: 23 IU/L (ref 0–44)
AST: 22 IU/L (ref 0–40)
Albumin/Globulin Ratio: 1.8 (ref 1.2–2.2)
Albumin: 5 g/dL (ref 4.0–5.0)
Alkaline Phosphatase: 76 IU/L (ref 39–117)
BUN/Creatinine Ratio: 11 (ref 9–20)
BUN: 10 mg/dL (ref 6–24)
Bilirubin Total: 0.5 mg/dL (ref 0.0–1.2)
CO2: 23 mmol/L (ref 20–29)
Calcium: 10 mg/dL (ref 8.7–10.2)
Chloride: 97 mmol/L (ref 96–106)
Creatinine, Ser: 0.89 mg/dL (ref 0.76–1.27)
GFR calc Af Amer: 119 mL/min/{1.73_m2} (ref >59)
GFR calc non Af Amer: 103 mL/min/{1.73_m2} (ref >59)
Globulin, Total: 2.8 g/dL (ref 1.5–4.5)
Glucose: 90 mg/dL (ref 65–99)
Potassium: 4.4 mmol/L (ref 3.5–5.2)
Sodium: 138 mmol/L (ref 134–144)
Total Protein: 7.8 g/dL (ref 6.0–8.5)

## 2018-03-27 LAB — NMR, LIPOPROFILE
Cholesterol, Total: 194 mg/dL (ref 100–199)
HDL Particle Number: 32 umol/L (ref >=30.5)
HDL-C: 45 mg/dL (ref >39)
LDL Particle Number: 1406 nmol/L — ABNORMAL HIGH (ref <1000)
LDL Size: 21.2 nm (ref >20.5)
LDL-C: 118 mg/dL — ABNORMAL HIGH (ref 0–99)
LP-IR Score: 49 — ABNORMAL HIGH (ref <=45)
Small LDL Particle Number: 618 nmol/L — ABNORMAL HIGH (ref <=527)
Triglycerides: 155 mg/dL — ABNORMAL HIGH (ref 0–149)

## 2018-03-27 LAB — APOLIPOPROTEIN B: Apolipoprotein B: 115 mg/dL — ABNORMAL HIGH (ref <90)

## 2018-03-27 LAB — LIPOPROTEIN A (LPA): Lipoprotein (a): 221.1 nmol/L — ABNORMAL HIGH (ref <75.0)

## 2018-03-27 NOTE — Addendum Note (Signed)
Addended by: Loa Socks on: 03/27/2018 04:04 PM   Modules accepted: Orders

## 2018-03-28 ENCOUNTER — Telehealth: Payer: Self-pay | Admitting: *Deleted

## 2018-03-28 DIAGNOSIS — R931 Abnormal findings on diagnostic imaging of heart and coronary circulation: Secondary | ICD-10-CM

## 2018-03-28 DIAGNOSIS — E785 Hyperlipidemia, unspecified: Secondary | ICD-10-CM

## 2018-03-28 MED ORDER — ROSUVASTATIN CALCIUM 5 MG PO TABS: 5.0000 mg | ORAL_TABLET | Freq: Every day | ORAL | 3 refills | Status: DC

## 2018-03-28 NOTE — Telephone Encounter (Signed)
-----   Message from Lars MassonKatarina H Nelson, MD sent at 03/28/2018 10:14 AM EST ----- He has elevated lipids including LDL and LDL small particles, including some of the hereditary markers such as lipoprotein a and apolipoprotein B, to get her with high calcium score, I would start him on rosuvastatin 5 mg daily and repeat lipids and LFTs in 2 months.

## 2018-03-28 NOTE — Telephone Encounter (Signed)
Spoke with the pt and endorsed to him his lab results and recommendations per Dr Delton SeeNelson, for him to start taking rosuvastatin 5 mg po daily, and come in for repeat lipids and LFTs in 2 months.  Confirmed the pharmacy of choice with the pt.  Scheduled the pt a repeat Lipids and LFTs in 2 months for 05/26/2018.  Pt is aware to come fasting to this lab appt.  Pt verbalized understanding and agrees with this plan.

## 2018-04-24 ENCOUNTER — Other Ambulatory Visit: Payer: Self-pay | Admitting: Cardiology

## 2018-05-22 ENCOUNTER — Telehealth: Payer: Self-pay | Admitting: Cardiology

## 2018-05-22 NOTE — Telephone Encounter (Signed)
New Message:    Pt wants to know if he still comes in for his appt on Monday?

## 2018-05-26 ENCOUNTER — Other Ambulatory Visit: Payer: Federal, State, Local not specified - PPO

## 2018-05-28 ENCOUNTER — Other Ambulatory Visit: Payer: Self-pay

## 2018-05-28 DIAGNOSIS — M549 Dorsalgia, unspecified: Secondary | ICD-10-CM | POA: Insufficient documentation

## 2018-05-28 DIAGNOSIS — Z79899 Other long term (current) drug therapy: Secondary | ICD-10-CM | POA: Insufficient documentation

## 2018-05-28 DIAGNOSIS — I10 Essential (primary) hypertension: Secondary | ICD-10-CM | POA: Diagnosis not present

## 2018-05-28 DIAGNOSIS — S39012A Strain of muscle, fascia and tendon of lower back, initial encounter: Secondary | ICD-10-CM | POA: Diagnosis not present

## 2018-05-29 ENCOUNTER — Emergency Department (HOSPITAL_COMMUNITY)
Admission: EM | Admit: 2018-05-29 | Discharge: 2018-05-29 | Disposition: A | Discharge status: Home / Self Care | Payer: Federal, State, Local not specified - PPO | Attending: Emergency Medicine | Admitting: Emergency Medicine

## 2018-05-29 ENCOUNTER — Encounter (HOSPITAL_COMMUNITY): Payer: Self-pay | Admitting: Emergency Medicine

## 2018-05-29 ENCOUNTER — Other Ambulatory Visit: Payer: Self-pay

## 2018-05-29 DIAGNOSIS — T148XXA Other injury of unspecified body region, initial encounter: Secondary | ICD-10-CM

## 2018-05-29 HISTORY — DX: Calculus of kidney: N20.0

## 2018-05-29 MED ORDER — KETOROLAC TROMETHAMINE 60 MG/2ML IM SOLN
30.0000 mg | Freq: Once | INTRAMUSCULAR | Status: AC
  Administered 2018-05-29: 30 mg via INTRAMUSCULAR
  Filled 2018-05-29: qty 2

## 2018-05-29 MED ORDER — CYCLOBENZAPRINE HCL 10 MG PO TABS: 5.0000 mg | ORAL_TABLET | Freq: Every day | ORAL | 0 refills | Status: AC

## 2018-05-29 NOTE — ED Provider Notes (Signed)
[facility] EMERGENCY DEPARTMENT Provider Note  CSN: [id number] Arrival date & time: 05/28/18 2358  Chief Complaint(s) Back Pain  HPI Leroy Leonard is a 48 y.o. male   The history is provided by the patient.  Back Pain  Location:  Sacro-iliac joint Quality:  Aching Radiates to:  Does not radiate Pain severity:  Severe Onset quality:  Gradual Duration:  1 week Timing:  Constant Progression:  Waxing and waning Chronicity:  New Context: lifting heavy objects   Context comment:  Was doing yard work this weekend and pulled a muscle Worsened by:  Movement, sitting and twisting Ineffective treatments:  NSAIDs and OTC medications Associated symptoms: no bladder incontinence, no bowel incontinence, no fever, no leg pain, no numbness, no paresthesias, no perianal numbness and no weakness   Risk factors: no hx of cancer, not obese and no recent surgery   Risk factors comment:  No IVDU   Past Medical History Past Medical History:  Diagnosis Date  . HTN (hypertension)   . Kidney stones   . SVT (supraventricular tachycardia) [facility])    Patient Active Problem List   Diagnosis Date Noted  . Pure hypercholesterolemia 11/20/2011  . Benign hypertensive heart disease without heart failure 05/01/2011  . Rapid palpitations 05/01/2011   Home Medication(s) Prior to Admission medications   Medication Sig Start Date End Date Taking? Authorizing Provider  amLODipine (NORVASC) 10 MG tablet TAKE 1 TABLET(10 MG) BY MOUTH DAILY 04/24/18   Lars Masson, MD  cyclobenzaprine (FLEXERIL) 10 MG tablet Take 0.5-1 tablets (5-10 mg total) by mouth at bedtime for 10 days. 05/29/18 06/08/18  Nira Conn, MD  lisinopril-hydrochlorothiazide (PRINZIDE,ZESTORETIC) 20-25 MG tablet TAKE 1 TABLET BY MOUTH DAILY 04/24/18   Lars Masson, MD  metoprolol succinate (TOPROL-XL) 100 MG 24 hr tablet TAKE 1 TABLET BY MOUTH EVERY DAY WITH OR IMMEDIATELY FOLLOWING A MEAL. 04/24/18   Lars Masson, MD  Multiple Vitamin (MULTIVITAMIN) tablet Take 1 tablet by mouth daily.    [provider]  rosuvastatin (CRESTOR) 5 MG tablet Take 1 tablet (5 mg total) by mouth daily. 03/28/18   Lars Masson, MD                                                                                                                                    Past Surgical History Past Surgical History:  Procedure Laterality Date  . CYSTECTOMY     leg   Family History Family History  Problem Relation Age of Onset  . Hypertension Mother   . COPD Father   . Hypertension Father   . Heart attack Maternal Grandfather     Social History Social History   Tobacco Use  . Smoking status: Never Smoker  . Smokeless tobacco: Never Used  Substance Use Topics  . Alcohol use: Yes    Alcohol/week: 4.0 standard drinks    Types: 4 Standard drinks  or equivalent per week  . Drug use: Never   Allergies Lipitor [atorvastatin]  Review of Systems Review of Systems  Constitutional: Negative for fever.  Gastrointestinal: Negative for bowel incontinence.  Genitourinary: Negative for bladder incontinence.  Musculoskeletal: Positive for back pain.  Neurological: Negative for weakness, numbness and paresthesias.   All other systems are reviewed and are negative for acute change except as noted in the HPI  Physical Exam Vital Signs  I have reviewed the triage vital signs BP (!) 127/92   Pulse 89   Temp 97.7 F (36.5 C) (Oral)   Resp 18   SpO2 96%   Physical Exam Vitals signs reviewed.  Constitutional:      General: He is not in acute distress.    Appearance: He is well-developed. He is not diaphoretic.  HENT:     Head: Normocephalic and atraumatic.     Jaw: No trismus.     Right Ear: External ear normal.     Left Ear: External ear normal.     Nose: Nose normal.  Eyes:     General: No scleral icterus.    Conjunctiva/sclera: Conjunctivae normal.  Neck:     Musculoskeletal: Normal range of  motion.     Trachea: Phonation normal.  Cardiovascular:     Rate and Rhythm: Normal rate and regular rhythm.  Pulmonary:     Effort: Pulmonary effort is normal. No respiratory distress.     Breath sounds: No stridor.  Abdominal:     General: There is no distension.  Musculoskeletal: Normal range of motion.     Lumbar back: He exhibits tenderness and spasm. He exhibits no bony tenderness.       Back:  Neurological:     Mental Status: He is alert and oriented to person, place, and time.  Psychiatric:        Behavior: Behavior normal.     ED Results and Treatments Labs (all labs ordered are listed, but only abnormal results are displayed) Labs Reviewed - No data to display                                                                                                                       EKG  EKG Interpretation  Date/Time:    Ventricular Rate:    PR Interval:    QRS Duration:   QT Interval:    QTC Calculation:   R Axis:     Text Interpretation:        Radiology No results found. Pertinent labs & imaging results that were available during my care of the patient were reviewed by me and considered in my medical decision making (see chart for details).  Medications Ordered in ED Medications  ketorolac (TORADOL) injection 30 mg (30 mg Intramuscular Given 05/29/18 0117)  Procedures Procedures  (including critical care time)  Medical Decision Making / ED Course I have reviewed the nursing notes for this encounter and the patient's prior records (if available in EHR or on provided paperwork).    48 y.o. male presents with back pain in lumbar area for 1 week without signs of radicular pain. No acute traumatic onset. No red flag symptoms of fever, weight loss, saddle anesthesia, weakness, fecal/urinary incontinence or urinary retention.   Suspect  MSK etiology. No indication for imaging emergently. Patient was recommended to take short course of scheduled NSAIDs and engage in early mobility as definitive treatment. Return precautions discussed for worsening or new concerning symptoms.    Final Clinical Impression(s) / ED Diagnoses Final diagnoses:  None   Disposition: Discharge  Condition: Good  I have discussed the results, Dx and Tx plan with the patient who expressed understanding and agree(s) with the plan. Discharge instructions discussed at great length. The patient was given strict return precautions who verbalized understanding of the instructions. No further questions at time of discharge.    ED Discharge Orders         Ordered    cyclobenzaprine (FLEXERIL) 10 MG tablet  Daily at bedtime     05/29/18 0115           Follow Up: Delorse LekBurnett, Brent A, MD [address] [place] [address] [phone]  Schedule an appointment as soon as possible for a visit  in 2-3 weeks, If symptoms do not improve or  worsen      This chart was dictated using voice recognition software.  Despite best efforts to proofread,  errors can occur which can change the documentation meaning.   Nira Connardama, Pedro Eduardo, MD 05/29/18 [phone]

## 2018-05-29 NOTE — Discharge Instructions (Signed)
You may use over-the-counter Motrin (Ibuprofen), Acetaminophen (Tylenol), topical muscle creams such as SalonPas, Icy Hot, Bengay, etc. Please stretch, apply heat, and have massage therapy for additional assistance. ° °

## 2018-05-29 NOTE — ED Triage Notes (Signed)
C/o lower back pain x 2 days.  States he has been doing a lot of yard work and lifting heavy bags.  Denies urinary complaints.

## 2018-07-21 ENCOUNTER — Telehealth: Payer: Self-pay | Admitting: *Deleted

## 2018-07-21 NOTE — Telephone Encounter (Signed)
    COVID-19 Pre-Screening Questions:  . In the past 7 to 10 days have you had a cough,  shortness of breath, headache, congestion, fever (100 or greater) body aches, chills, sore throat, or sudden loss of taste or sense of smell? . Have you been around anyone with known Covid 19. . Have you been around anyone who is awaiting Covid 19 test results in the past 7 to 10 days? . Have you been around anyone who has been exposed to Covid 19, or has mentioned symptoms of Covid 19 within the past 7 to 10 days?  If you have any concerns/questions about symptoms patients report during screening (either on the phone or at threshold). Contact the provider seeing the patient or DOD for further guidance.  If neither are available contact a member of the leadership team.           Contacted patient via telephone call. All no's to Covid 19 Questionaire. Has a mask.  KB

## 2018-07-22 ENCOUNTER — Other Ambulatory Visit: Payer: Federal, State, Local not specified - PPO

## 2018-07-22 ENCOUNTER — Other Ambulatory Visit: Payer: Self-pay

## 2018-07-22 DIAGNOSIS — R931 Abnormal findings on diagnostic imaging of heart and coronary circulation: Secondary | ICD-10-CM | POA: Diagnosis not present

## 2018-07-22 DIAGNOSIS — E785 Hyperlipidemia, unspecified: Secondary | ICD-10-CM | POA: Diagnosis not present

## 2018-07-22 LAB — LIPID PANEL
Chol/HDL Ratio: 2.9 ratio (ref 0.0–5.0)
Cholesterol, Total: 145 mg/dL (ref 100–199)
HDL: 50 mg/dL (ref >39)
LDL Calculated: 65 mg/dL (ref 0–99)
Triglycerides: 148 mg/dL (ref 0–149)
VLDL Cholesterol Cal: 30 mg/dL (ref 5–40)

## 2018-07-22 LAB — HEPATIC FUNCTION PANEL
ALT: 30 IU/L (ref 0–44)
AST: 22 IU/L (ref 0–40)
Albumin: 4.8 g/dL (ref 4.0–5.0)
Alkaline Phosphatase: 58 IU/L (ref 39–117)
Bilirubin Total: 0.4 mg/dL (ref 0.0–1.2)
Bilirubin, Direct: 0.14 mg/dL (ref 0.00–0.40)
Total Protein: 6.9 g/dL (ref 6.0–8.5)

## 2018-07-23 ENCOUNTER — Other Ambulatory Visit: Payer: Self-pay | Admitting: Cardiology

## 2018-07-23 DIAGNOSIS — E782 Mixed hyperlipidemia: Secondary | ICD-10-CM

## 2018-08-05 ENCOUNTER — Other Ambulatory Visit: Payer: Self-pay | Admitting: Cardiology

## 2018-08-05 DIAGNOSIS — E782 Mixed hyperlipidemia: Secondary | ICD-10-CM

## 2018-08-05 NOTE — Telephone Encounter (Signed)
Order Providers  Prescribing Provider Encounter Provider  Dorothy Spark, MD Nuala Alpha, LPN  Outpatient Medication Detail   Disp Refills Start End   rosuvastatin (CRESTOR) 5 MG tablet 90 tablet 3 03/28/2018    Sig - Route: Take 1 tablet (5 mg total) by mouth daily. - Oral   Sent to pharmacy as: rosuvastatin (CRESTOR) 5 MG tablet   E-Prescribing Status: Receipt confirmed by pharmacy (03/28/2018 11:03 AM EST)   Associated Diagnoses  Elevated coronary artery calcium score - Primary     Hyperlipidemia, unspecified hyperlipidemia type     [place], [place] - [address]

## 2019-01-13 DIAGNOSIS — Z03818 Encounter for observation for suspected exposure to other biological agents ruled out: Secondary | ICD-10-CM | POA: Diagnosis not present

## 2019-03-27 ENCOUNTER — Encounter: Payer: Self-pay | Admitting: Cardiology

## 2019-03-27 ENCOUNTER — Other Ambulatory Visit: Payer: Self-pay

## 2019-03-27 ENCOUNTER — Ambulatory Visit: Payer: Federal, State, Local not specified - PPO | Admitting: Cardiology

## 2019-03-27 VITALS — BP 134/82 | HR 91 | Ht 70.0 in | Wt 196.0 lb

## 2019-03-27 DIAGNOSIS — R931 Abnormal findings on diagnostic imaging of heart and coronary circulation: Secondary | ICD-10-CM | POA: Diagnosis not present

## 2019-03-27 DIAGNOSIS — E785 Hyperlipidemia, unspecified: Secondary | ICD-10-CM | POA: Diagnosis not present

## 2019-03-27 DIAGNOSIS — E782 Mixed hyperlipidemia: Secondary | ICD-10-CM | POA: Diagnosis not present

## 2019-03-27 DIAGNOSIS — Z8249 Family history of ischemic heart disease and other diseases of the circulatory system: Secondary | ICD-10-CM

## 2019-03-27 DIAGNOSIS — I119 Hypertensive heart disease without heart failure: Secondary | ICD-10-CM | POA: Diagnosis not present

## 2019-03-27 MED ORDER — ICOSAPENT ETHYL 1 G PO CAPS: 2.0000 g | ORAL_CAPSULE | Freq: Every day | ORAL | 1 refills | Status: DC

## 2019-03-27 NOTE — Patient Instructions (Signed)
Medication Instructions:   START TAKING VASCEPA 2 GRAMS BY MOUTH DAILY--IF YOUR INSURANCE APPROVES THIS STOP TAKING FISH OIL.  IF YOUR INSURANCE DOES NOT APPROVE THIS, CONTINUE TAKING YOUR FISH OIL   *If you need a refill on your cardiac medications before your next appointment, please call your pharmacy*   Lab Work:  TODAY--CMET, TSH, CBC, VITAMIN D LEVEL, AND LIPIDS  If you have labs (blood work) drawn today and your tests are completely normal, you will receive your results only by: Marland Kitchen MyChart Message (if you have MyChart) OR . A paper copy in the mail If you have any lab test that is abnormal or we need to change your treatment, we will call you to review the results.    Follow-Up: At [facility], you and your health needs are our priority.  As part of our continuing mission to provide you with exceptional heart care, we have created designated Provider Care Teams.  These Care Teams include your primary Cardiologist (physician) and Advanced Practice Providers (APPs -  Physician Assistants and Nurse Practitioners) who all work together to provide you with the care you need, when you need it.  Your next appointment:   12 month(s)  The format for your next appointment:   In Person  Provider:   Tobias Alexander, MD

## 2019-03-27 NOTE — Progress Notes (Signed)
Cardiology Office Note   Date:  03/27/2019   ID:  AYO SMOAK, DOB [birth year], MRN [id number]  PCP:  Stephens Shire, MD  Cardiologist:  Dr. Meda Coffee  Is in for visit : 1 year follow up   History of Present Illness: Leroy Leonard is a 49 y.o. male with a history of HTN, HLD and SVT who presents to clinic for 1 year follow up.   2D ECHO (2010) mild LVH, normal systolic and diastolic function. He was seen by Dr. Mare Ferrari last year for yearly follow up and doing well from a cardiac standpoint with no symptoms. He was very active doing moderate exercise and weight training at home and riding bikes with his wife. He has a history of intolerance to Lipitor. He tolerated Crestor but it was too expensive. He was up 5 lbs and Dr. Mare Ferrari recommended weight loss. Fasting lipid/hepatic panel done in 02/2014. LDL 78. LFTs normal.   He has a strong family history of CAD. Maternal GF and maternal aunt had heart disease in 40s and 23s.   03/27/2019 -this is 1 year follow-up, the patient has been doing great, he continues to exercise up to 5 times a week, denies any chest pain, shortness of breath dizziness or syncope.  He continues to take all of his medicine and has no side effects.  He denies any recent palpitations.   Past Medical History:  Diagnosis Date  . HTN (hypertension)   . Kidney stones   . SVT (supraventricular tachycardia) (HCC)     Past Surgical History:  Procedure Laterality Date  . CYSTECTOMY     leg     Current Outpatient Medications  Medication Sig Dispense Refill  . amLODipine (NORVASC) 10 MG tablet TAKE 1 TABLET(10 MG) BY MOUTH DAILY 90 tablet 3  . Ascorbic Acid (VITAMIN C PO) Take 1 tablet by mouth as needed.    Marland Kitchen lisinopril-hydrochlorothiazide (PRINZIDE,ZESTORETIC) 20-25 MG tablet TAKE 1 TABLET BY MOUTH DAILY 90 tablet 3  . metoprolol succinate (TOPROL-XL) 100 MG 24 hr tablet TAKE 1 TABLET BY MOUTH EVERY DAY WITH OR IMMEDIATELY FOLLOWING A MEAL. 90 tablet 3  .  Multiple Vitamin (MULTIVITAMIN) tablet Take 1 tablet by mouth daily.    . Multiple Vitamins-Minerals (ZINC PO) Take 1 tablet by mouth as needed.    . Omega-3 Fatty Acids (FISH OIL PO) Take 1 capsule by mouth daily.    . rosuvastatin (CRESTOR) 5 MG tablet Take 1 tablet (5 mg total) by mouth daily. 90 tablet 3  . VITAMIN D PO Take 1 tablet by mouth daily.    Marland Kitchen VITAMIN K PO Take 1 tablet by mouth daily.    Marland Kitchen icosapent Ethyl (VASCEPA) 1 g capsule Take 2 capsules (2 g total) by mouth daily. 180 capsule 1   No current facility-administered medications for this visit.   Allergies:   Lipitor [atorvastatin]   Social History:  The patient  reports that he has never smoked. He has never used smokeless tobacco. He reports current alcohol use of about 4.0 standard drinks of alcohol per week. He reports that he does not use drugs.   Family History:  The patient's family history includes COPD in his father; Heart attack in his maternal grandfather; Hypertension in his father and mother.   ROS:  Please see the history of present illness.   Otherwise, review of systems are positive for NONE.   All other systems are reviewed and negative.   PHYSICAL EXAM: VS:  BP 134/82   Pulse 91   Ht '5\' 10"'$  (1.778 m)   Wt 196 lb (88.9 kg)   SpO2 97%   BMI 28.12 kg/m  , BMI Body mass index is 28.12 kg/m. GEN: Well nourished, well developed, in no acute distress  HEENT: normal  Neck: no JVD, carotid bruits, or masses Cardiac: RRR; no murmurs, rubs, or gallops,no edema  Respiratory:  clear to auscultation bilaterally, normal work of breathing GI: soft, nontender, nondistended, + BS MS: no deformity or atrophy  Skin: warm and dry, no rash Neuro:  Strength and sensation are intact Psych: euthymic mood, full affect   EKG:  EKG is ordered today. The ekg ordered today demonstrates NSR, normal EKG and unchanged from prior. This was personally reviewed.   Recent Labs: 07/22/2018: ALT 30    Lipid Panel      Component Value Date/Time   CHOL 145 07/22/2018 0733   TRIG 148 07/22/2018 0733   HDL 50 07/22/2018 0733   CHOLHDL 2.9 07/22/2018 0733   CHOLHDL 4.6 02/08/2015 0826   VLDL 44 (H) 02/08/2015 0826   LDLCALC 65 07/22/2018 0733   LDLDIRECT 139.6 01/11/2014 0851      Wt Readings from Last 3 Encounters:  03/27/19 196 lb (88.9 kg)  03/26/18 195 lb 6.4 oz (88.6 kg)  04/19/17 201 lb (91.2 kg)      Other studies Reviewed: Additional studies/ records that were reviewed today include: 2D ECHO Review of the above records demonstrates:   2D ECHO (2010) mild LVH, normal systolic and diastolic function.    ASSESSMENT AND PLAN:  HTN: Well-controlled.  SVT: well controlled on Toprol XL '100mg'$ . No further episodes.  HLD: The patient has a strong history of premature coronary artery disease, he was started on Crestor and fish oil that he tolerates well.  His most recent lipids where in June 2020 and showed HDL 50, LDL 65 and triglycerides 148.  We will switch fish oil to Vascepa 2 g.  We will obtain CMP, lipids, CBC, TSH today. Follow-up in one year unless he has new symptoms.  Current medicines are reviewed at length with the patient today.  The patient does not have concerns regarding medicines.  The following changes have been made:  no change  Labs/ tests ordered today include:  Orders Placed This Encounter  Procedures  . CBC  . Comp Met (CMET)  . TSH  . Vitamin D (25 hydroxy)  . Lipid Profile  . EKG 12-Lead     Disposition:   FU with Dr. Meda Coffee in 1 year  Signed, Ena Dawley, MD  03/27/2019 1:30 PM    [address], [place], [place]  [address] Phone: [phone]; Fax: [phone]

## 2019-03-28 LAB — COMPREHENSIVE METABOLIC PANEL
ALT: 29 IU/L (ref 0–44)
AST: 24 IU/L (ref 0–40)
Albumin/Globulin Ratio: 1.7 (ref 1.2–2.2)
Albumin: 4.5 g/dL (ref 4.0–5.0)
Alkaline Phosphatase: 74 IU/L (ref 39–117)
BUN/Creatinine Ratio: 11 (ref 9–20)
BUN: 10 mg/dL (ref 6–24)
Bilirubin Total: 0.3 mg/dL (ref 0.0–1.2)
CO2: 23 mmol/L (ref 20–29)
Calcium: 9.5 mg/dL (ref 8.7–10.2)
Chloride: 98 mmol/L (ref 96–106)
Creatinine, Ser: 0.92 mg/dL (ref 0.76–1.27)
GFR calc Af Amer: 114 mL/min/{1.73_m2} (ref >59)
GFR calc non Af Amer: 99 mL/min/{1.73_m2} (ref >59)
Globulin, Total: 2.7 g/dL (ref 1.5–4.5)
Glucose: 99 mg/dL (ref 65–99)
Potassium: 4.1 mmol/L (ref 3.5–5.2)
Sodium: 138 mmol/L (ref 134–144)
Total Protein: 7.2 g/dL (ref 6.0–8.5)

## 2019-03-28 LAB — LIPID PANEL
Chol/HDL Ratio: 2.9 ratio (ref 0.0–5.0)
Cholesterol, Total: 126 mg/dL (ref 100–199)
HDL: 43 mg/dL
LDL Chol Calc (NIH): 63 mg/dL (ref 0–99)
Triglycerides: 112 mg/dL (ref 0–149)
VLDL Cholesterol Cal: 20 mg/dL (ref 5–40)

## 2019-03-28 LAB — CBC
Hematocrit: 45.8 % (ref 37.5–51.0)
Hemoglobin: 15.8 g/dL (ref 13.0–17.7)
MCH: 30.9 pg (ref 26.6–33.0)
MCHC: 34.5 g/dL (ref 31.5–35.7)
MCV: 90 fL (ref 79–97)
Platelets: 356 10*3/uL (ref 150–450)
RBC: 5.12 x10E6/uL (ref 4.14–5.80)
RDW: 11.9 % (ref 11.6–15.4)
WBC: 5.6 10*3/uL (ref 3.4–10.8)

## 2019-03-28 LAB — VITAMIN D 25 HYDROXY (VIT D DEFICIENCY, FRACTURES): Vit D, 25-Hydroxy: 62.5 ng/mL (ref 30.0–100.0)

## 2019-03-28 LAB — TSH: TSH: 1.46 u[IU]/mL (ref 0.450–4.500)

## 2019-04-14 ENCOUNTER — Other Ambulatory Visit: Payer: Self-pay | Admitting: Cardiology

## 2019-04-14 DIAGNOSIS — E785 Hyperlipidemia, unspecified: Secondary | ICD-10-CM

## 2019-04-14 DIAGNOSIS — R931 Abnormal findings on diagnostic imaging of heart and coronary circulation: Secondary | ICD-10-CM

## 2019-04-19 ENCOUNTER — Other Ambulatory Visit: Payer: Self-pay | Admitting: Cardiology

## 2019-05-07 ENCOUNTER — Ambulatory Visit: Payer: Federal, State, Local not specified - PPO | Attending: Internal Medicine

## 2019-05-07 DIAGNOSIS — Z23 Encounter for immunization: Secondary | ICD-10-CM

## 2019-05-07 NOTE — Progress Notes (Signed)
   Covid-19 Vaccination Clinic  Name:  Leroy Leonard    MRN: [id number] DOB: 1970/11/28  05/07/2019  Mr. [name] was observed post Covid-19 immunization for 15 minutes without incident. He was provided with Vaccine Information Sheet and instruction to access the V-Safe system.   Mr. [name] was instructed to call 911 with any severe reactions post vaccine: Marland Kitchen Difficulty breathing  . Swelling of face and throat  . A fast heartbeat  . A bad rash all over body  . Dizziness and weakness   Immunizations Administered    Name Date Dose VIS Date Route   Pfizer COVID-19 Vaccine 05/07/2019  1:15 PM 0.3 mL 01/30/2019 Intramuscular   Manufacturer: [employer], [employer]   Lot: [id number]   NDC: [address]-2

## 2019-06-01 ENCOUNTER — Ambulatory Visit: Payer: Federal, State, Local not specified - PPO | Attending: Internal Medicine

## 2019-06-01 DIAGNOSIS — Z23 Encounter for immunization: Secondary | ICD-10-CM

## 2019-06-01 NOTE — Progress Notes (Signed)
   Covid-19 Vaccination Clinic  Name:  Leroy Leonard    MRN: [id number] DOB: 25-Mar-1970  06/01/2019  [name] was observed post Covid-19 immunization for 15 minutes without incident. He was provided with Vaccine Information Sheet and instruction to access the V-Safe system.   [name] was instructed to call 911 with any severe reactions post vaccine: Marland Kitchen Difficulty breathing  . Swelling of face and throat  . A fast heartbeat  . A bad rash all over body  . Dizziness and weakness   Immunizations Administered    Name Date Dose VIS Date Route   Pfizer COVID-19 Vaccine 06/01/2019  2:00 PM 0.3 mL 01/30/2019 Intramuscular   Manufacturer: [employer], [employer]   Lot: [id number]   NDC: [address]-3

## 2019-10-12 ENCOUNTER — Other Ambulatory Visit: Payer: Self-pay | Admitting: Cardiology

## 2019-10-12 DIAGNOSIS — I119 Hypertensive heart disease without heart failure: Secondary | ICD-10-CM

## 2019-10-12 DIAGNOSIS — R931 Abnormal findings on diagnostic imaging of heart and coronary circulation: Secondary | ICD-10-CM

## 2019-10-12 DIAGNOSIS — Z8249 Family history of ischemic heart disease and other diseases of the circulatory system: Secondary | ICD-10-CM

## 2019-10-12 DIAGNOSIS — E782 Mixed hyperlipidemia: Secondary | ICD-10-CM

## 2019-10-12 DIAGNOSIS — E785 Hyperlipidemia, unspecified: Secondary | ICD-10-CM

## 2020-04-03 ENCOUNTER — Other Ambulatory Visit: Payer: Self-pay | Admitting: Cardiology

## 2020-04-03 DIAGNOSIS — E785 Hyperlipidemia, unspecified: Secondary | ICD-10-CM

## 2020-04-03 DIAGNOSIS — R931 Abnormal findings on diagnostic imaging of heart and coronary circulation: Secondary | ICD-10-CM

## 2020-04-04 ENCOUNTER — Other Ambulatory Visit: Payer: Self-pay | Admitting: Cardiology

## 2020-04-05 ENCOUNTER — Other Ambulatory Visit: Payer: Self-pay | Admitting: Cardiology

## 2020-04-09 ENCOUNTER — Other Ambulatory Visit: Payer: Self-pay | Admitting: Cardiology

## 2020-04-09 DIAGNOSIS — E785 Hyperlipidemia, unspecified: Secondary | ICD-10-CM

## 2020-04-09 DIAGNOSIS — E782 Mixed hyperlipidemia: Secondary | ICD-10-CM

## 2020-04-09 DIAGNOSIS — I119 Hypertensive heart disease without heart failure: Secondary | ICD-10-CM

## 2020-04-09 DIAGNOSIS — R931 Abnormal findings on diagnostic imaging of heart and coronary circulation: Secondary | ICD-10-CM

## 2020-04-09 DIAGNOSIS — Z8249 Family history of ischemic heart disease and other diseases of the circulatory system: Secondary | ICD-10-CM

## 2020-04-11 NOTE — Progress Notes (Unsigned)
Cardiology Office Note    Date:  04/13/2020   ID:  Leroy Leonard, DOB 1970/08/29, MRN [id number]   PCP:  Delorse Lek, MD   [facility]  Cardiologist:  Tobias Alexander, MD  Advanced Practice Provider:  No care team member to display Electrophysiologist:  None   :[id number]}   Chief Complaint  Patient presents with  . Follow-up    History of Present Illness:  Leroy Leonard is a 50 y.o. male with history of hypertension with mild LVH on echo 2010, HLD, SVT, family history of early CAD.  Patient last saw Dr. Delton See 03/27/2019 at which time he was doing well and no changes made.  Patient comes in for f/u. Denies any recurrent SVT. HR gets up if he does anything strenuous but no SVT. Denies chest pain, palpitations, dyspnea, dyspnea on exertion, dizziness or presyncope. Exercises 30-60 min daily-walking, rowing machine. Works as Research scientist (life sciences).     Past Medical History:  Diagnosis Date  . HTN (hypertension)   . Kidney stones   . SVT (supraventricular tachycardia) (HCC)     Past Surgical History:  Procedure Laterality Date  . CYSTECTOMY     leg    Current Medications: Current Meds  Medication Sig  . amLODipine (NORVASC) 10 MG tablet TAKE 1 TABLET(10 MG) BY MOUTH DAILY  . Ascorbic Acid (VITAMIN C PO) Take 1 tablet by mouth as needed.  Marland Kitchen icosapent Ethyl (VASCEPA) 1 g capsule TAKE 2 CAPSULES(2 GRAMS) BY MOUTH DAILY  . lisinopril-hydrochlorothiazide (ZESTORETIC) 20-25 MG tablet Take 1 tablet by mouth daily. Keep follow up appointment in February to receive further refills.  . metoprolol succinate (TOPROL-XL) 100 MG 24 hr tablet TAKE 1 TABLET BY MOUTH DAILY WITH OR IMMEDIATELY FOLLOWING A MEAL  . Multiple Vitamins-Minerals (ZINC PO) Take 1 tablet by mouth as needed.  . rosuvastatin (CRESTOR) 5 MG tablet Take 1 tablet (5 mg total) by mouth daily.  Marland Kitchen VITAMIN D PO Take 1 tablet by mouth daily.  Marland Kitchen VITAMIN K PO Take 1 tablet by mouth daily.      Allergies:   Lipitor [atorvastatin]   Social History   Socioeconomic History  . Marital status: Single    Spouse name: Not on file  . Number of children: Not on file  . Years of education: Not on file  . Highest education level: Not on file  Occupational History  . Not on file  Tobacco Use  . Smoking status: Never Smoker  . Smokeless tobacco: Never Used  Substance and Sexual Activity  . Alcohol use: Yes    Alcohol/week: 4.0 standard drinks    Types: 4 Standard drinks or equivalent per week  . Drug use: Never  . Sexual activity: Not on file  Other Topics Concern  . Not on file  Social History Narrative  . Not on file   Social Determinants of Health   Financial Resource Strain: Not on file  Food Insecurity: Not on file  Transportation Needs: Not on file  Physical Activity: Not on file  Stress: Not on file  Social Connections: Not on file     Family History:  The patient's family history includes COPD in his father; Heart attack in his maternal grandfather; Hypertension in his father and mother.   ROS:   Please see the history of present illness.    ROS All other systems reviewed and are negative.   PHYSICAL EXAM:   VS:  BP 120/84  Pulse 84   Ht 5\' 10"  (1.778 m)   Wt 192 lb (87.1 kg)   SpO2 98%   BMI 27.55 kg/m   Physical Exam  GEN: Well nourished, well developed, in no acute distress  Neck: no JVD, carotid bruits, or masses Cardiac:RRR; no murmurs, rubs, or gallops  Respiratory:  clear to auscultation bilaterally, normal work of breathing GI: soft, nontender, nondistended, + BS Ext: without cyanosis, clubbing, or edema, Good distal pulses bilaterally Neuro:  Alert and Oriented x 3 Psych: euthymic mood, full affect  Wt Readings from Last 3 Encounters:  04/13/20 192 lb (87.1 kg)  03/27/19 196 lb (88.9 kg)  03/26/18 195 lb 6.4 oz (88.6 kg)      Studies/Labs Reviewed:   EKG:  EKG is  ordered today.  The ekg ordered today demonstrates NSR normal  EKG  Recent Labs: No results found for requested labs within last 8760 hours.   Lipid Panel    Component Value Date/Time   CHOL 126 03/27/2019 0905   TRIG 112 03/27/2019 0905   HDL 43 03/27/2019 0905   CHOLHDL 2.9 03/27/2019 0905   CHOLHDL 4.6 02/08/2015 0826   VLDL 44 (H) 02/08/2015 0826   LDLCALC 63 03/27/2019 0905   LDLDIRECT 139.6 01/11/2014 0851    Additional studies/ records that were reviewed today include:  2D ECHO (2010) mild LVH, normal systolic and diastolic function.       Risk Assessment/Calculations:         ASSESSMENT:    1. Essential hypertension   2. Hyperlipidemia, unspecified hyperlipidemia type   3. SVT (supraventricular tachycardia) (HCC)   4. Family history of early CAD      PLAN:  In order of problems listed above:  Hypertension with mild LVH on echo 2010, BP well controlled on amlodipine, Zestoretic, metoprolol  HLD on Crestor and Vascepa, LDL 63 03/2019  SVT on Toprol-XL 100 mg daily  Family history of early CAD MGF died 52's M-stents 77's, aunt MI 60's-Calcium score 24 03/2018   Shared Decision Making/Informed Consent        Medication Adjustments/Labs and Tests Ordered: Current medicines are reviewed at length with the patient today.  Concerns regarding medicines are outlined above.  Medication changes, Labs and Tests ordered today are listed in the Patient Instructions below. Patient Instructions  Medication Instructions:  Your physician recommends that you continue on your current medications as directed. Please refer to the Current Medication list given to you today.  *If you need a refill on your cardiac medications before your next appointment, please call your pharmacy*   Lab Work: TODAY: FLP, CMET, CBC, TSH If you have labs (blood work) drawn today and your tests are completely normal, you will receive your results only by: 04/2018 MyChart Message (if you have MyChart) OR . A paper copy in the mail If you have any lab test  that is abnormal or we need to change your treatment, we will call you to review the results.   Follow-Up: At [facility], you and your health needs are our priority.  As part of our continuing mission to provide you with exceptional heart care, we have created designated Provider Care Teams.  These Care Teams include your primary Cardiologist (physician) and Advanced Practice Providers (APPs -  Physician Assistants and Nurse Practitioners) who all work together to provide you with the care you need, when you need it.  Your next appointment:   1 year(s)  The format for your next appointment:  In Person  Provider:   Laurance Flatten, MD      Signed, Jacolyn Reedy, PA-C  04/13/2020 9:53 AM    [facility] [address] [place], [place], [place]  [address] Phone: [phone]; Fax: [phone]

## 2020-04-13 ENCOUNTER — Ambulatory Visit: Payer: Federal, State, Local not specified - PPO | Admitting: Physician Assistant

## 2020-04-13 ENCOUNTER — Other Ambulatory Visit: Payer: Self-pay

## 2020-04-13 ENCOUNTER — Encounter: Payer: Self-pay | Admitting: Physician Assistant

## 2020-04-13 VITALS — BP 120/84 | HR 84 | Ht 70.0 in | Wt 192.0 lb

## 2020-04-13 DIAGNOSIS — E785 Hyperlipidemia, unspecified: Secondary | ICD-10-CM

## 2020-04-13 DIAGNOSIS — I10 Essential (primary) hypertension: Secondary | ICD-10-CM | POA: Diagnosis not present

## 2020-04-13 DIAGNOSIS — I471 Supraventricular tachycardia: Secondary | ICD-10-CM | POA: Diagnosis not present

## 2020-04-13 DIAGNOSIS — Z8249 Family history of ischemic heart disease and other diseases of the circulatory system: Secondary | ICD-10-CM

## 2020-04-13 LAB — COMPREHENSIVE METABOLIC PANEL
ALT: 28 IU/L (ref 0–44)
AST: 17 IU/L (ref 0–40)
Albumin/Globulin Ratio: 1.7 (ref 1.2–2.2)
Albumin: 5 g/dL (ref 4.0–5.0)
Alkaline Phosphatase: 79 IU/L (ref 44–121)
BUN/Creatinine Ratio: 13 (ref 9–20)
BUN: 10 mg/dL (ref 6–24)
Bilirubin Total: 0.6 mg/dL (ref 0.0–1.2)
CO2: 23 mmol/L (ref 20–29)
Calcium: 10.3 mg/dL — ABNORMAL HIGH (ref 8.7–10.2)
Chloride: 98 mmol/L (ref 96–106)
Creatinine, Ser: 0.8 mg/dL (ref 0.76–1.27)
GFR calc Af Amer: 122 mL/min/{1.73_m2} (ref >59)
GFR calc non Af Amer: 106 mL/min/{1.73_m2} (ref >59)
Globulin, Total: 3 g/dL (ref 1.5–4.5)
Glucose: 101 mg/dL — ABNORMAL HIGH (ref 65–99)
Potassium: 4.4 mmol/L (ref 3.5–5.2)
Sodium: 135 mmol/L (ref 134–144)
Total Protein: 8 g/dL (ref 6.0–8.5)

## 2020-04-13 LAB — CBC
Hematocrit: 47 % (ref 37.5–51.0)
Hemoglobin: 16.3 g/dL (ref 13.0–17.7)
MCH: 30 pg (ref 26.6–33.0)
MCHC: 34.7 g/dL (ref 31.5–35.7)
MCV: 87 fL (ref 79–97)
Platelets: 399 10*3/uL (ref 150–450)
RBC: 5.43 x10E6/uL (ref 4.14–5.80)
RDW: 12.7 % (ref 11.6–15.4)
WBC: 6.1 10*3/uL (ref 3.4–10.8)

## 2020-04-13 LAB — TSH: TSH: 1.56 u[IU]/mL (ref 0.450–4.500)

## 2020-04-13 LAB — LIPID PANEL
Chol/HDL Ratio: 3.5 ratio (ref 0.0–5.0)
Cholesterol, Total: 182 mg/dL (ref 100–199)
HDL: 52 mg/dL (ref >39)
LDL Chol Calc (NIH): 106 mg/dL — ABNORMAL HIGH (ref 0–99)
Triglycerides: 137 mg/dL (ref 0–149)
VLDL Cholesterol Cal: 24 mg/dL (ref 5–40)

## 2020-04-13 NOTE — Patient Instructions (Signed)
Medication Instructions:  Your physician recommends that you continue on your current medications as directed. Please refer to the Current Medication list given to you today.  *If you need a refill on your cardiac medications before your next appointment, please call your pharmacy*   Lab Work: TODAY: FLP, CMET, CBC, TSH If you have labs (blood work) drawn today and your tests are completely normal, you will receive your results only by: Marland Kitchen MyChart Message (if you have MyChart) OR . A paper copy in the mail If you have any lab test that is abnormal or we need to change your treatment, we will call you to review the results.   Follow-Up: At [facility], you and your health needs are our priority.  As part of our continuing mission to provide you with exceptional heart care, we have created designated Provider Care Teams.  These Care Teams include your primary Cardiologist (physician) and Advanced Practice Providers (APPs -  Physician Assistants and Nurse Practitioners) who all work together to provide you with the care you need, when you need it.  Your next appointment:   1 year(s)  The format for your next appointment:   In Person  Provider:   Laurance Flatten, MD

## 2020-04-14 ENCOUNTER — Other Ambulatory Visit: Payer: Self-pay

## 2020-04-14 DIAGNOSIS — E785 Hyperlipidemia, unspecified: Secondary | ICD-10-CM

## 2020-04-14 DIAGNOSIS — R931 Abnormal findings on diagnostic imaging of heart and coronary circulation: Secondary | ICD-10-CM

## 2020-04-14 MED ORDER — ROSUVASTATIN CALCIUM 10 MG PO TABS: 10.0000 mg | ORAL_TABLET | Freq: Every day | ORAL | 3 refills | Status: DC

## 2020-05-04 ENCOUNTER — Other Ambulatory Visit: Payer: Self-pay | Admitting: Cardiology

## 2020-06-09 ENCOUNTER — Other Ambulatory Visit: Payer: Self-pay | Admitting: Cardiology

## 2020-06-09 DIAGNOSIS — E785 Hyperlipidemia, unspecified: Secondary | ICD-10-CM

## 2020-06-09 DIAGNOSIS — E782 Mixed hyperlipidemia: Secondary | ICD-10-CM

## 2020-06-09 DIAGNOSIS — I119 Hypertensive heart disease without heart failure: Secondary | ICD-10-CM

## 2020-06-09 DIAGNOSIS — R931 Abnormal findings on diagnostic imaging of heart and coronary circulation: Secondary | ICD-10-CM

## 2020-06-09 DIAGNOSIS — Z8249 Family history of ischemic heart disease and other diseases of the circulatory system: Secondary | ICD-10-CM

## 2020-06-17 ENCOUNTER — Other Ambulatory Visit: Payer: Self-pay | Admitting: Cardiology

## 2020-06-17 DIAGNOSIS — R931 Abnormal findings on diagnostic imaging of heart and coronary circulation: Secondary | ICD-10-CM

## 2020-06-17 DIAGNOSIS — E785 Hyperlipidemia, unspecified: Secondary | ICD-10-CM

## 2020-06-21 IMAGING — CT CT HEART SCORING
2 series · 16 of 20 positions shown, 18 images · non-contrast
Comparison: None.

Addendum:
EXAM:
OVER-READ INTERPRETATION  CT CHEST

The following report is an over-read performed by radiologist Dr.
Adonay Mor [REDACTED] on 03/26/2018. This
over-read does not include interpretation of cardiac or coronary
anatomy or pathology. The coronary calcium score interpretation by
the cardiologist is attached.
CLINICAL DATA: Risk stratification
Coronary Calcium Score
TECHNIQUE: The patient was scanned on a Siemens Force scanner. Axial
non-contrast 3 mm slices were carried out through the heart. The
data set was analyzed on a dedicated work station and scored using
the Agatson method.

[Series 3: casc 3.0 i36f 2 bestdiast 74 % · axial · 0.42mm/px · z∈[+1268,+1355]mm · 8 of 39 slices shown, 10 images]
[im 5/39  vessel]
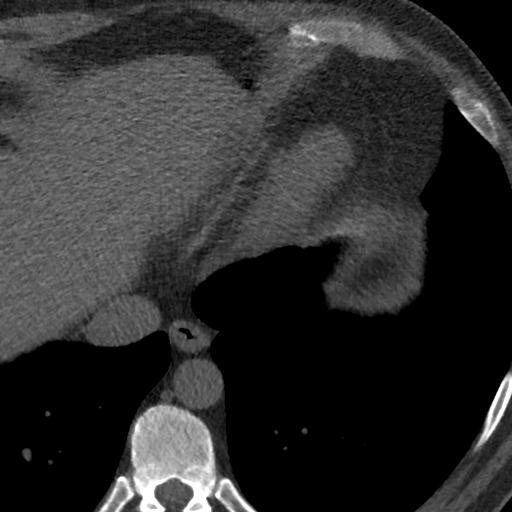
[im 5/39  lung]
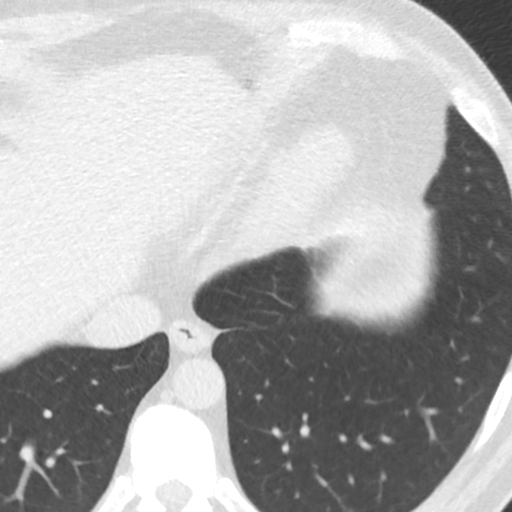
[im 9/39  vessel]
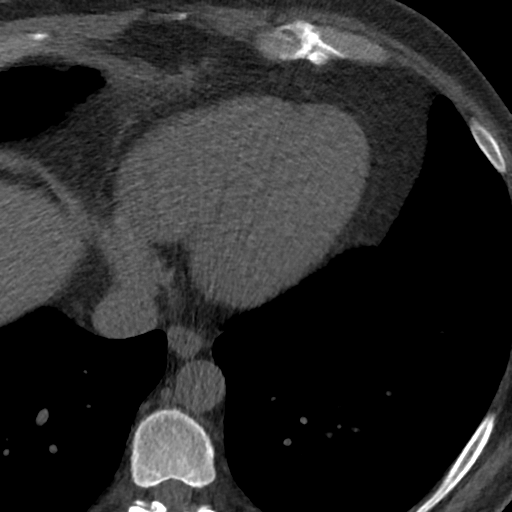
[im 13/39  vessel]
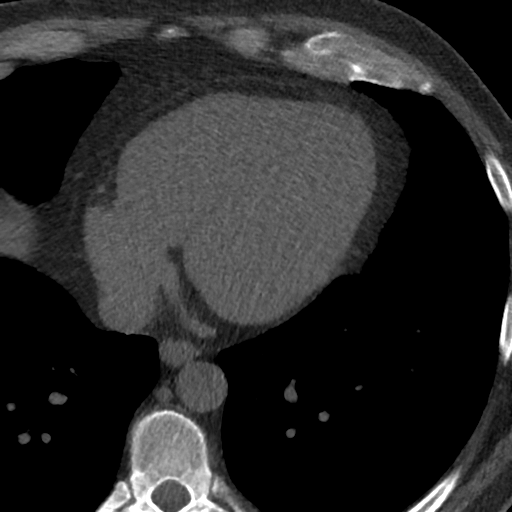
[im 17/39  vessel]
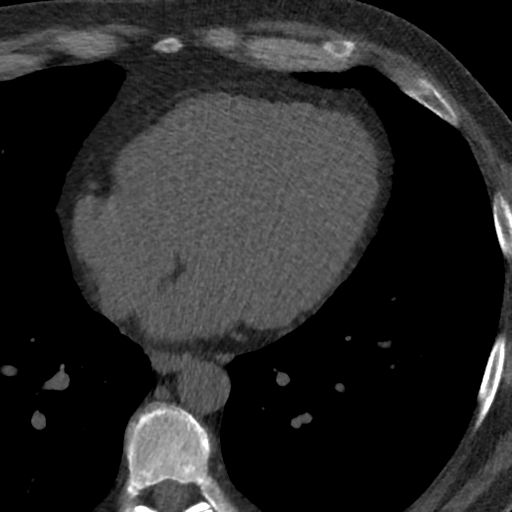
[im 22/39  vessel]
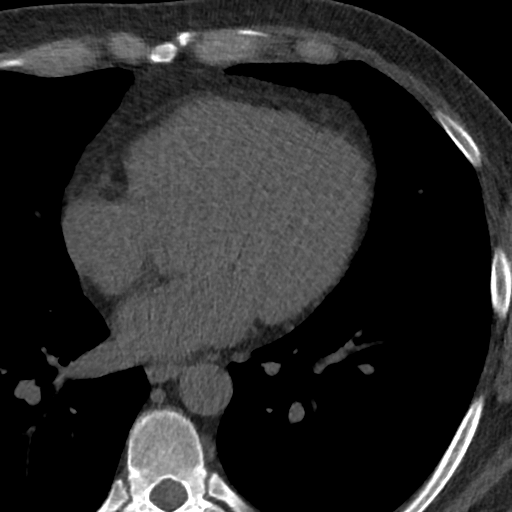
[im 22/39  lung]
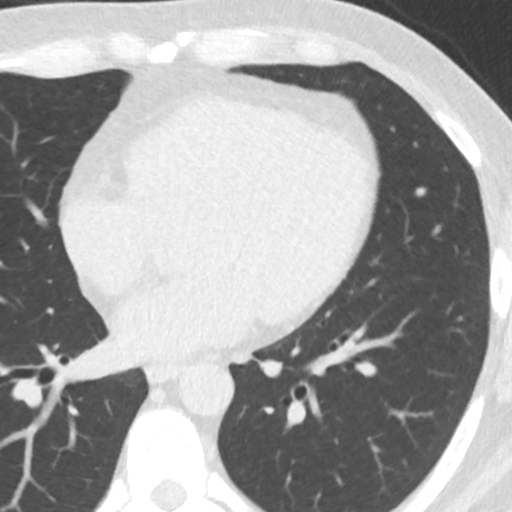
[im 26/39  vessel]
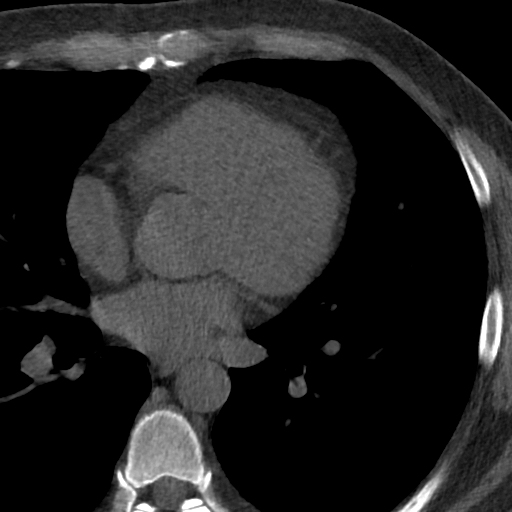
[im 30/39  vessel]
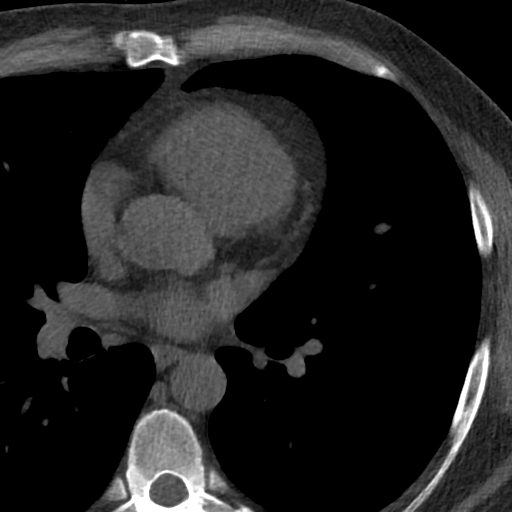
[im 34/39  vessel]
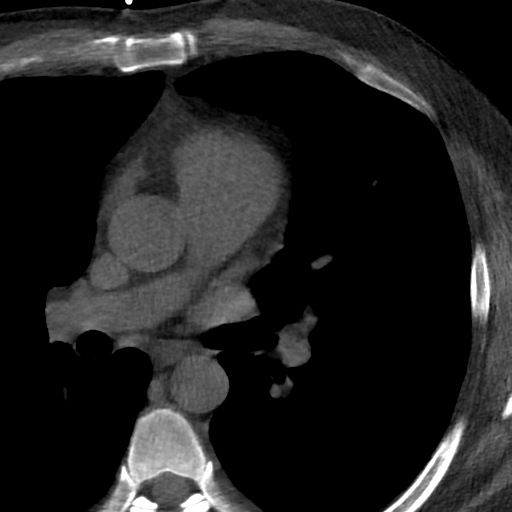

[Series 5: lung st 71 % · axial · 0.71mm/px · z∈[+1266,+1356]mm · 8 of 40 slices shown]
[im 5/40  lung]
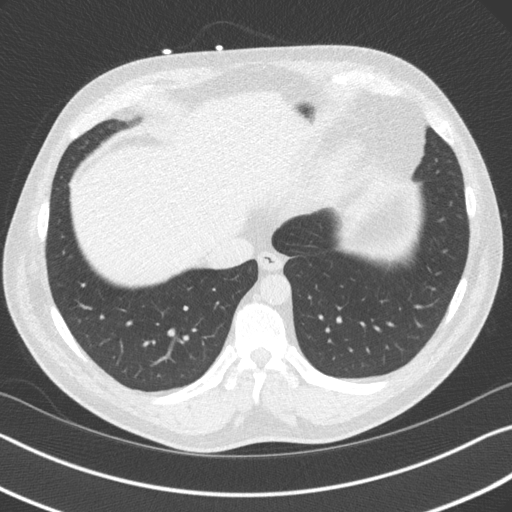
[im 9/40  lung]
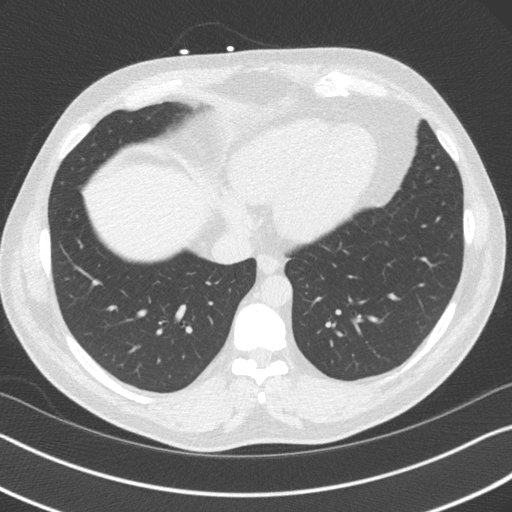
[im 14/40  lung]
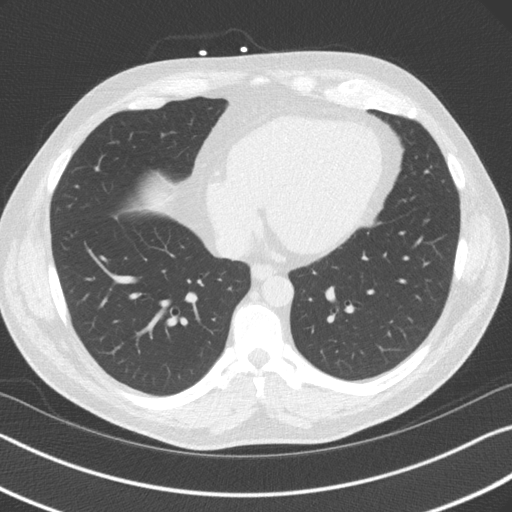
[im 18/40  lung]
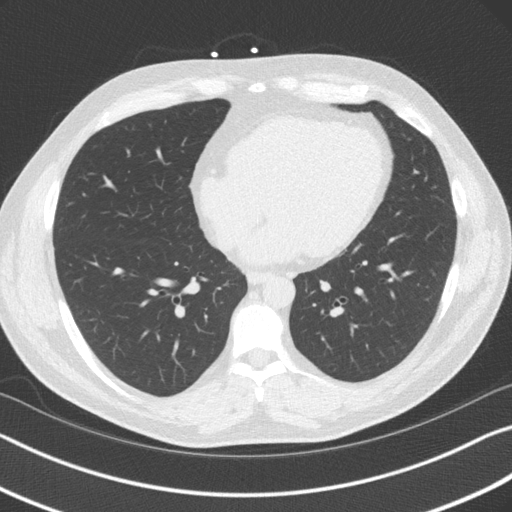
[im 22/40  lung]
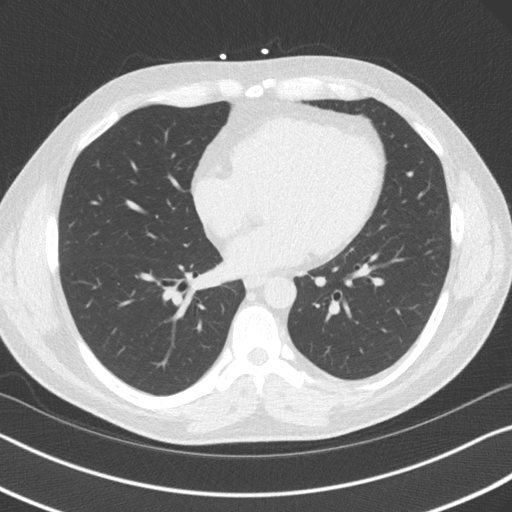
[im 27/40  lung]
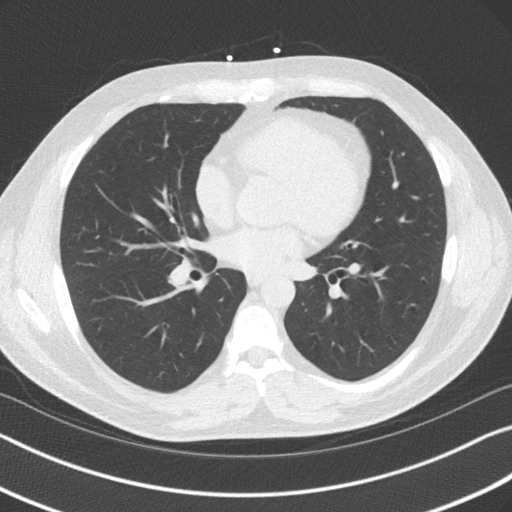
[im 31/40  lung]
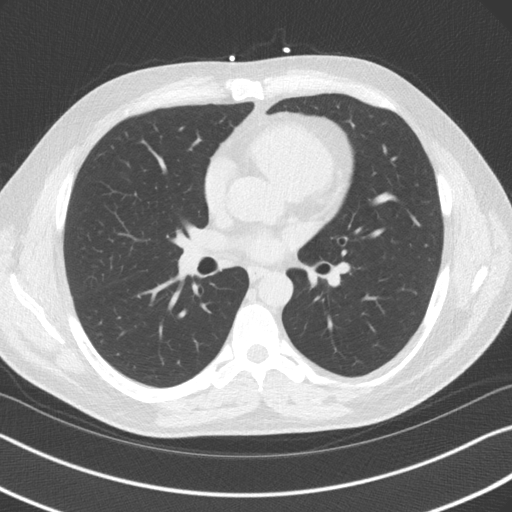
[im 35/40  lung]
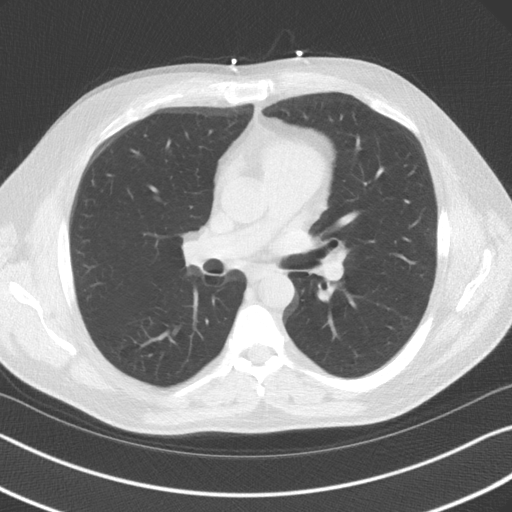

[16 of 20 positions shown; findings below may reference images not displayed]

FINDINGS: Limited view of the lung parenchyma demonstrates no suspicious
nodularity. Airways are normal.

Limited view of the mediastinum demonstrates no adenopathy.
Esophagus normal.

Limited view of the upper abdomen unremarkable.

Limited view of the skeleton and chest wall is unremarkable.
IMPRESSION: No significant extracardiac findings.
FINDINGS: Non-cardiac: See separate report from [REDACTED].

Ascending Aorta: Normal size, no calcifications.

Pericardium: Normal.

Coronary arteries: Normal origin.
IMPRESSION: Coronary calcium score of 92. This was 93 percentile for age and sex
matched control.

*** End of Addendum ***

## 2020-07-12 ENCOUNTER — Other Ambulatory Visit: Payer: Self-pay

## 2020-07-12 ENCOUNTER — Other Ambulatory Visit: Payer: Federal, State, Local not specified - PPO | Admitting: *Deleted

## 2020-07-12 DIAGNOSIS — E785 Hyperlipidemia, unspecified: Secondary | ICD-10-CM | POA: Diagnosis not present

## 2020-07-12 DIAGNOSIS — R931 Abnormal findings on diagnostic imaging of heart and coronary circulation: Secondary | ICD-10-CM

## 2020-07-12 LAB — LIPID PANEL
Chol/HDL Ratio: 2.7 ratio (ref 0.0–5.0)
Cholesterol, Total: 137 mg/dL (ref 100–199)
HDL: 51 mg/dL (ref >39)
LDL Chol Calc (NIH): 58 mg/dL (ref 0–99)
Triglycerides: 165 mg/dL — ABNORMAL HIGH (ref 0–149)
VLDL Cholesterol Cal: 28 mg/dL (ref 5–40)

## 2020-09-15 ENCOUNTER — Other Ambulatory Visit: Payer: Self-pay | Admitting: *Deleted

## 2020-09-15 DIAGNOSIS — E785 Hyperlipidemia, unspecified: Secondary | ICD-10-CM

## 2020-09-15 DIAGNOSIS — R931 Abnormal findings on diagnostic imaging of heart and coronary circulation: Secondary | ICD-10-CM

## 2020-09-15 MED ORDER — ROSUVASTATIN CALCIUM 10 MG PO TABS: 10.0000 mg | ORAL_TABLET | Freq: Every day | ORAL | 3 refills | Status: DC

## 2021-03-10 ENCOUNTER — Other Ambulatory Visit: Payer: Self-pay | Admitting: *Deleted

## 2021-03-10 DIAGNOSIS — Z8249 Family history of ischemic heart disease and other diseases of the circulatory system: Secondary | ICD-10-CM

## 2021-03-10 DIAGNOSIS — R931 Abnormal findings on diagnostic imaging of heart and coronary circulation: Secondary | ICD-10-CM

## 2021-03-10 DIAGNOSIS — E785 Hyperlipidemia, unspecified: Secondary | ICD-10-CM

## 2021-03-10 DIAGNOSIS — E782 Mixed hyperlipidemia: Secondary | ICD-10-CM

## 2021-03-10 DIAGNOSIS — I119 Hypertensive heart disease without heart failure: Secondary | ICD-10-CM

## 2021-03-10 MED ORDER — ICOSAPENT ETHYL 1 G PO CAPS: ORAL_CAPSULE | ORAL | 3 refills | Status: DC

## 2021-03-10 MED ORDER — LISINOPRIL-HYDROCHLOROTHIAZIDE 20-25 MG PO TABS: 1.0000 | ORAL_TABLET | Freq: Every day | ORAL | 3 refills | Status: DC

## 2021-04-27 NOTE — Progress Notes (Unsigned)
Cardiology Office Note:    Date:  04/27/2021   ID:  Leroy Leonard, DOB 1970/11/20, MRN [id number]  PCP:  Stephens Shire, MD   [facility] Providers Cardiologist:  Ena Dawley, MD { Click to update primary MD,subspecialty MD or APP then REFRESH:1}    Referring MD: Stephens Shire, MD   No chief complaint on file. ***  History of Present Illness:    Leroy Leonard is a 51 y.o. male with a hx of ***  Past Medical History:  Diagnosis Date   HTN (hypertension)    Kidney stones    SVT (supraventricular tachycardia) (HCC)     Past Surgical History:  Procedure Laterality Date   CYSTECTOMY     leg    Current Medications: No outpatient medications have been marked as taking for the 05/05/21 encounter (Appointment) with Freada Bergeron, MD.     Allergies:   Lipitor [atorvastatin]   Social History   Socioeconomic History   Marital status: Single    Spouse name: Not on file   Number of children: Not on file   Years of education: Not on file   Highest education level: Not on file  Occupational History   Not on file  Tobacco Use   Smoking status: Never   Smokeless tobacco: Never  Substance and Sexual Activity   Alcohol use: Yes    Alcohol/week: 4.0 standard drinks    Types: 4 Standard drinks or equivalent per week   Drug use: Never   Sexual activity: Not on file  Other Topics Concern   Not on file  Social History Narrative   Not on file   Social Determinants of Health   Financial Resource Strain: Not on file  Food Insecurity: Not on file  Transportation Needs: Not on file  Physical Activity: Not on file  Stress: Not on file  Social Connections: Not on file     Family History: The patient's ***family history includes COPD in his father; Heart attack in his maternal grandfather; Hypertension in his father and mother.  ROS:   Please see the history of present illness.    *** All other systems reviewed and are negative.  EKGs/Labs/Other  Studies Reviewed:    The following studies were reviewed today: ***  EKG:  EKG is *** ordered today.  The ekg ordered today demonstrates ***  Recent Labs: No results found for requested labs within last 8760 hours.  Recent Lipid Panel    Component Value Date/Time   CHOL 137 07/12/2020 0759   TRIG 165 (H) 07/12/2020 0759   HDL 51 07/12/2020 0759   CHOLHDL 2.7 07/12/2020 0759   CHOLHDL 4.6 02/08/2015 0826   VLDL 44 (H) 02/08/2015 0826   LDLCALC 58 07/12/2020 0759   LDLDIRECT 139.6 01/11/2014 0851     Risk Assessment/Calculations:   {Does this patient have ATRIAL FIBRILLATION?:[phone]}       Physical Exam:    VS:  There were no vitals taken for this visit.    Wt Readings from Last 3 Encounters:  04/13/20 192 lb (87.1 kg)  03/27/19 196 lb (88.9 kg)  03/26/18 195 lb 6.4 oz (88.6 kg)     GEN: *** Well nourished, well developed in no acute distress HEENT: Normal NECK: No JVD; No carotid bruits LYMPHATICS: No lymphadenopathy CARDIAC: ***RRR, no murmurs, rubs, gallops RESPIRATORY:  Clear to auscultation without rales, wheezing or rhonchi  ABDOMEN: Soft, non-tender, non-distended MUSCULOSKELETAL:  No edema; No deformity  SKIN: Warm and dry  NEUROLOGIC:  Alert and oriented x 3 PSYCHIATRIC:  Normal affect   ASSESSMENT:    No diagnosis found. PLAN:    In order of problems listed above:  ***      {Are you ordering a CV Procedure (e.g. stress test, cath, DCCV, TEE, etc)?   Press F2        :[id number]    Medication Adjustments/Labs and Tests Ordered: Current medicines are reviewed at length with the patient today.  Concerns regarding medicines are outlined above.  No orders of the defined types were placed in this encounter.  No orders of the defined types were placed in this encounter.   There are no Patient Instructions on file for this visit.   Signed, Freada Bergeron, MD  04/27/2021 3:39 PM    [place]

## 2021-05-05 ENCOUNTER — Ambulatory Visit: Payer: Federal, State, Local not specified - PPO | Admitting: Cardiology

## 2021-05-05 ENCOUNTER — Other Ambulatory Visit: Payer: Self-pay

## 2021-05-05 ENCOUNTER — Encounter: Payer: Self-pay | Admitting: Cardiology

## 2021-05-05 VITALS — BP 138/82 | HR 108 | Ht 70.0 in | Wt 195.0 lb

## 2021-05-05 DIAGNOSIS — I119 Hypertensive heart disease without heart failure: Secondary | ICD-10-CM | POA: Diagnosis not present

## 2021-05-05 DIAGNOSIS — E7800 Pure hypercholesterolemia, unspecified: Secondary | ICD-10-CM

## 2021-05-05 DIAGNOSIS — Z8249 Family history of ischemic heart disease and other diseases of the circulatory system: Secondary | ICD-10-CM | POA: Diagnosis not present

## 2021-05-05 DIAGNOSIS — I10 Essential (primary) hypertension: Secondary | ICD-10-CM

## 2021-05-05 DIAGNOSIS — E785 Hyperlipidemia, unspecified: Secondary | ICD-10-CM

## 2021-05-05 DIAGNOSIS — R931 Abnormal findings on diagnostic imaging of heart and coronary circulation: Secondary | ICD-10-CM

## 2021-05-05 DIAGNOSIS — Z79899 Other long term (current) drug therapy: Secondary | ICD-10-CM

## 2021-05-05 LAB — LIPID PANEL
Chol/HDL Ratio: 3.1 ratio (ref 0.0–5.0)
Cholesterol, Total: 137 mg/dL (ref 100–199)
HDL: 44 mg/dL (ref >39)
LDL Chol Calc (NIH): 72 mg/dL (ref 0–99)
Triglycerides: 116 mg/dL (ref 0–149)
VLDL Cholesterol Cal: 21 mg/dL (ref 5–40)

## 2021-05-05 LAB — COMPREHENSIVE METABOLIC PANEL
ALT: 28 IU/L (ref 0–44)
AST: 20 IU/L (ref 0–40)
Albumin/Globulin Ratio: 1.8 (ref 1.2–2.2)
Albumin: 4.8 g/dL (ref 4.0–5.0)
Alkaline Phosphatase: 59 IU/L (ref 44–121)
BUN/Creatinine Ratio: 9 (ref 9–20)
BUN: 9 mg/dL (ref 6–24)
Bilirubin Total: 0.6 mg/dL (ref 0.0–1.2)
CO2: 25 mmol/L (ref 20–29)
Calcium: 9.9 mg/dL (ref 8.7–10.2)
Chloride: 99 mmol/L (ref 96–106)
Creatinine, Ser: 0.98 mg/dL (ref 0.76–1.27)
Globulin, Total: 2.7 g/dL (ref 1.5–4.5)
Glucose: 95 mg/dL (ref 70–99)
Potassium: 4.1 mmol/L (ref 3.5–5.2)
Sodium: 137 mmol/L (ref 134–144)
Total Protein: 7.5 g/dL (ref 6.0–8.5)
eGFR: 95 mL/min/{1.73_m2} (ref >59)

## 2021-05-05 LAB — CBC WITH DIFFERENTIAL/PLATELET
Basophils Absolute: 0 10*3/uL (ref 0.0–0.2)
Basos: 1 %
EOS (ABSOLUTE): 0.1 10*3/uL (ref 0.0–0.4)
Eos: 2 %
Hematocrit: 46.4 % (ref 37.5–51.0)
Hemoglobin: 16.2 g/dL (ref 13.0–17.7)
Immature Grans (Abs): 0 10*3/uL (ref 0.0–0.1)
Immature Granulocytes: 0 %
Lymphocytes Absolute: 2.4 10*3/uL (ref 0.7–3.1)
Lymphs: 40 %
MCH: 31.3 pg (ref 26.6–33.0)
MCHC: 34.9 g/dL (ref 31.5–35.7)
MCV: 90 fL (ref 79–97)
Monocytes Absolute: 0.5 10*3/uL (ref 0.1–0.9)
Monocytes: 8 %
Neutrophils Absolute: 2.9 10*3/uL (ref 1.4–7.0)
Neutrophils: 49 %
Platelets: 339 10*3/uL (ref 150–450)
RBC: 5.17 x10E6/uL (ref 4.14–5.80)
RDW: 11.6 % (ref 11.6–15.4)
WBC: 6 10*3/uL (ref 3.4–10.8)

## 2021-05-05 LAB — TSH: TSH: 1.27 u[IU]/mL (ref 0.450–4.500)

## 2021-05-05 NOTE — Patient Instructions (Signed)
Medication Instructions:  ? ?Your physician recommends that you continue on your current medications as directed. Please refer to the Current Medication list given to you today. ? ?*If you need a refill on your cardiac medications before your next appointment, please call your pharmacy* ? ? ?Lab Work: ? ?TODAY--CMET, CBC W DIFF, TSH, AND LIPIDS ? ?If you have labs (blood work) drawn today and your tests are completely normal, you will receive your results only by: ?MyChart Message (if you have MyChart) OR ?A paper copy in the mail ?If you have any lab test that is abnormal or we need to change your treatment, we will call you to review the results. ? ? ? ?Follow-Up: ?At [facility], you and your health needs are our priority.  As part of our continuing mission to provide you with exceptional heart care, we have created designated Provider Care Teams.  These Care Teams include your primary Cardiologist (physician) and Advanced Practice Providers (APPs -  Physician Assistants and Nurse Practitioners) who all work together to provide you with the care you need, when you need it. ? ?We recommend signing up for the patient portal called "MyChart".  Sign up information is provided on this After Visit Summary.  MyChart is used to connect with patients for Virtual Visits (Telemedicine).  Patients are able to view lab/test results, encounter notes, upcoming appointments, etc.  Non-urgent messages can be sent to your provider as well.   ?To learn more about what you can do with MyChart, go to [email or web address].   ? ?Your next appointment:   ?1 year(s) ? ?The format for your next appointment:   ?In Person ? ?Provider:   ?DR. PEMBERTON ? ?

## 2021-06-08 ENCOUNTER — Other Ambulatory Visit: Payer: Self-pay | Admitting: *Deleted

## 2021-06-08 MED ORDER — METOPROLOL SUCCINATE ER 100 MG PO TB24: ORAL_TABLET | ORAL | 3 refills | Status: DC

## 2021-06-08 MED ORDER — AMLODIPINE BESYLATE 10 MG PO TABS: ORAL_TABLET | ORAL | 3 refills | Status: DC

## 2021-08-30 ENCOUNTER — Other Ambulatory Visit: Payer: Self-pay

## 2021-08-30 DIAGNOSIS — E785 Hyperlipidemia, unspecified: Secondary | ICD-10-CM

## 2021-08-30 DIAGNOSIS — R931 Abnormal findings on diagnostic imaging of heart and coronary circulation: Secondary | ICD-10-CM

## 2021-08-30 MED ORDER — ROSUVASTATIN CALCIUM 10 MG PO TABS: 10.0000 mg | ORAL_TABLET | Freq: Every day | ORAL | 2 refills | Status: DC

## 2022-03-05 ENCOUNTER — Other Ambulatory Visit: Payer: Self-pay

## 2022-03-05 DIAGNOSIS — Z8249 Family history of ischemic heart disease and other diseases of the circulatory system: Secondary | ICD-10-CM

## 2022-03-05 DIAGNOSIS — I119 Hypertensive heart disease without heart failure: Secondary | ICD-10-CM

## 2022-03-05 DIAGNOSIS — R931 Abnormal findings on diagnostic imaging of heart and coronary circulation: Secondary | ICD-10-CM

## 2022-03-05 DIAGNOSIS — E782 Mixed hyperlipidemia: Secondary | ICD-10-CM

## 2022-03-05 DIAGNOSIS — E785 Hyperlipidemia, unspecified: Secondary | ICD-10-CM

## 2022-03-05 MED ORDER — LISINOPRIL-HYDROCHLOROTHIAZIDE 20-25 MG PO TABS: 1.0000 | ORAL_TABLET | Freq: Every day | ORAL | 0 refills | Status: DC

## 2022-03-05 MED ORDER — ICOSAPENT ETHYL 1 G PO CAPS: ORAL_CAPSULE | ORAL | 0 refills | Status: DC

## 2022-03-05 NOTE — Addendum Note (Signed)
Addended by: Carter Kitten D on: 03/05/2022 12:36 PM   Modules accepted: Orders

## 2022-05-28 NOTE — Progress Notes (Signed)
Cardiology Office Note:    Date:  06/06/2022   ID:  Leroy Leonard, DOB 11/11/1970, MRN [id number]  PCP:  Delorse LekBurnett, Brent A, MD   [facility] Providers Cardiologist:  Tobias AlexanderKatarina Nelson, MD {   Referring MD: Delorse LekBurnett, Brent A, MD     History of Present Illness:    Leroy Leonard is a 52 y.o. male with a hx of HTN with mild LVH, HLD, SVT and family history of CAD who was previously followed by Dr. Delton SeeNelson who now returns to clinic for follow-up.  Ca score 03/2018 was 92 (93% for age 44matched, sex matched controls).  Was last seen in clinic on 04/2021 where he was doing very well from a CV standpoint.   Today, the patient overall feels well. No chest pain, SOB, orthopnea, PND, LE edmea. No palpitations. Blood pressure is well controlled on current medications. Remains active without significant exertional symptoms.   Past Medical History:  Diagnosis Date   HTN (hypertension)    Kidney stones    SVT (supraventricular tachycardia)     Past Surgical History:  Procedure Laterality Date   CYSTECTOMY     leg    Current Medications: Current Meds  Medication Sig   amLODipine (NORVASC) 10 MG tablet TAKE 1 TABLET(10 MG) BY MOUTH DAILY   Ascorbic Acid (VITAMIN C PO) Take 1 tablet by mouth as needed.   icosapent Ethyl (VASCEPA) 1 g capsule TAKE 2 CAPSULES(2 GRAMS) BY MOUTH DAILY   lisinopril-hydrochlorothiazide (ZESTORETIC) 20-25 MG tablet Take 1 tablet by mouth daily.   metoprolol succinate (TOPROL-XL) 100 MG 24 hr tablet Take one (1) tablet by mouth ( 100 mg ) daily. Take with or immediately following a meal.   Multiple Vitamins-Minerals (ZINC PO) Take 1 tablet by mouth as needed.   rosuvastatin (CRESTOR) 10 MG tablet Take 1 tablet (10 mg total) by mouth daily.   VITAMIN D PO Take 1 tablet by mouth daily.     Allergies:   Lipitor [atorvastatin]   Social History   Socioeconomic History   Marital status: Single    Spouse name: Not on file   Number of children: Not on file    Years of education: Not on file   Highest education level: Not on file  Occupational History   Not on file  Tobacco Use   Smoking status: Never   Smokeless tobacco: Never  Substance and Sexual Activity   Alcohol use: Yes    Alcohol/week: 4.0 standard drinks of alcohol    Types: 4 Standard drinks or equivalent per week   Drug use: Never   Sexual activity: Not on file  Other Topics Concern   Not on file  Social History Narrative   Not on file   Social Determinants of Health   Financial Resource Strain: Not on file  Food Insecurity: Not on file  Transportation Needs: Not on file  Physical Activity: Not on file  Stress: Not on file  Social Connections: Not on file     Family History: The patient's family history includes COPD in his father; Heart attack in his maternal grandfather; Hypertension in his father and mother.  ROS:   Please see the history of present illness.    Review of Systems  Constitutional:  Negative for malaise/fatigue.  Respiratory:  Negative for shortness of breath.   Cardiovascular:  Negative for chest pain, palpitations, orthopnea, claudication, leg swelling and PND.  Gastrointestinal:  Negative for melena.  Genitourinary:  Negative for hematuria.  Musculoskeletal:  Negative for falls.  Neurological:  Negative for dizziness and loss of consciousness.     EKGs/Labs/Other Studies Reviewed:    The following studies were reviewed today: Coronary Ca Score 03/26/18: FINDINGS: Non-cardiac: See separate report from [facility].   Ascending Aorta: Normal size, no calcifications.   Pericardium: Normal.   Coronary arteries: Normal origin.   IMPRESSION: Coronary calcium score of 92. This was 67 percentile for age and sex matched control.    EKG:  EKG is  ordered today.  The ekg ordered today demonstrates NSR, lateral q waves, HR 92  Recent Labs: No results found for requested labs within last 365 days.  Recent Lipid Panel    Component  Value Date/Time   CHOL 137 05/05/2021 0838   TRIG 116 05/05/2021 0838   HDL 44 05/05/2021 0838   CHOLHDL 3.1 05/05/2021 0838   CHOLHDL 4.6 02/08/2015 0826   VLDL 44 (H) 02/08/2015 0826   LDLCALC 72 05/05/2021 0838   LDLDIRECT 139.6 01/11/2014 0851         Physical Exam:    VS:  BP 128/84   Pulse 92   Ht 5\' 10"  (1.778 m)   Wt 194 lb 9.6 oz (88.3 kg)   SpO2 97%   BMI 27.92 kg/m     Wt Readings from Last 3 Encounters:  06/06/22 194 lb 9.6 oz (88.3 kg)  05/05/21 195 lb (88.5 kg)  04/13/20 192 lb (87.1 kg)     GEN:  Well nourished, well developed in no acute distress HEENT: Normal NECK: No JVD; No carotid bruits CARDIAC: RRR, no murmurs, rubs, gallops RESPIRATORY:  Clear to auscultation without rales, wheezing or rhonchi  ABDOMEN: Soft, non-tender, non-distended MUSCULOSKELETAL:  No edema; No deformity  SKIN: Warm and dry NEUROLOGIC:  Alert and oriented x 3 PSYCHIATRIC:  Normal affect   ASSESSMENT:    1. Elevated coronary artery calcium score   2. Hyperlipidemia, unspecified hyperlipidemia type   3. Essential hypertension   4. Family history of early CAD   5. Nonspecific abnormal electrocardiogram (ECG) (EKG)   6. Medication management    PLAN:    In order of problems listed above:  #HTN with Mild LVH: Very well controlled and at goal. -Continue metop 100mg  XL daily -Continue lisinopril-HCTZ 20-25mg  daily -Continue amlodipine 10mg  daily -Low Na diet -Check CMET today for monitoring  #Abnormal ECG: -ECG today with new lateral q waves but no exertional/anginal symotoms -Will check TTE for further evaluation  #HLD: #Coronary Ca: #Family history of CAD: Ca score 92 which is the 93% for age, gender matched controls.  -Continue crestor 10mg  daily -Continue vascepa 2g BID -Check lipids today  #SVT: Well controlled. No significant palpitations. -Continue metop 100mg  XL daily       Medication Adjustments/Labs and Tests Ordered: Current medicines are  reviewed at length with the patient today.  Concerns regarding medicines are outlined above.  Orders Placed This Encounter  Procedures   Comp Met (CMET)   CBC   TSH   Lipid Profile   HgB A1c   EKG 12-Lead   ECHOCARDIOGRAM COMPLETE   No orders of the defined types were placed in this encounter.   Patient Instructions  Medication Instructions:   Your physician recommends that you continue on your current medications as directed. Please refer to the Current Medication list given to you today.  *If you need a refill on your cardiac medications before your next appointment, please call your pharmacy*   Lab Work:  TODAY--CMET, LIPIDS,  CBC, TSH, AND A1C  If you have labs (blood work) drawn today and your tests are completely normal, you will receive your results only by: MyChart Message (if you have MyChart) OR A paper copy in the mail If you have any lab test that is abnormal or we need to change your treatment, we will call you to review the results.   Testing/Procedures:  Your physician has requested that you have an echocardiogram. Echocardiography is a painless test that uses sound waves to create images of your heart. It provides your doctor with information about the size and shape of your heart and how well your heart's chambers and valves are working. This procedure takes approximately one hour. There are no restrictions for this procedure. Please do NOT wear cologne, perfume, aftershave, or lotions (deodorant is allowed). Please arrive 15 minutes prior to your appointment time.    Follow-Up: At [facility], you and your health needs are our priority.  As part of our continuing mission to provide you with exceptional heart care, we have created designated Provider Care Teams.  These Care Teams include your primary Cardiologist (physician) and Advanced Practice Providers (APPs -  Physician Assistants and Nurse Practitioners) who all work together to provide you  with the care you need, when you need it.  We recommend signing up for the patient portal called "MyChart".  Sign up information is provided on this After Visit Summary.  MyChart is used to connect with patients for Virtual Visits (Telemedicine).  Patients are able to view lab/test results, encounter notes, upcoming appointments, etc.  Non-urgent messages can be sent to your provider as well.   To learn more about what you can do with MyChart, go to [email or web address].    Your next appointment:   1 year(s)  Provider:   DR. Shari ProwsPEMBERTON      Signed, Meriam SpragueHeather E Odella Appelhans, MD  06/06/2022 9:22 AM    [facility]

## 2022-06-04 ENCOUNTER — Other Ambulatory Visit: Payer: Self-pay

## 2022-06-04 DIAGNOSIS — E782 Mixed hyperlipidemia: Secondary | ICD-10-CM

## 2022-06-04 DIAGNOSIS — R931 Abnormal findings on diagnostic imaging of heart and coronary circulation: Secondary | ICD-10-CM

## 2022-06-04 DIAGNOSIS — E785 Hyperlipidemia, unspecified: Secondary | ICD-10-CM

## 2022-06-04 DIAGNOSIS — I119 Hypertensive heart disease without heart failure: Secondary | ICD-10-CM

## 2022-06-04 DIAGNOSIS — Z8249 Family history of ischemic heart disease and other diseases of the circulatory system: Secondary | ICD-10-CM

## 2022-06-04 MED ORDER — AMLODIPINE BESYLATE 10 MG PO TABS: ORAL_TABLET | ORAL | 0 refills | Status: DC

## 2022-06-04 MED ORDER — METOPROLOL SUCCINATE ER 100 MG PO TB24: ORAL_TABLET | ORAL | 0 refills | Status: DC

## 2022-06-04 MED ORDER — LISINOPRIL-HYDROCHLOROTHIAZIDE 20-25 MG PO TABS: 1.0000 | ORAL_TABLET | Freq: Every day | ORAL | 0 refills | Status: DC

## 2022-06-04 MED ORDER — ICOSAPENT ETHYL 1 G PO CAPS: ORAL_CAPSULE | ORAL | 0 refills | Status: DC

## 2022-06-04 NOTE — Addendum Note (Signed)
Addended by: Margaret Pyle D on: 06/04/2022 11:13 AM   Modules accepted: Orders

## 2022-06-04 NOTE — Addendum Note (Signed)
Addended by: Margaret Pyle D on: 06/04/2022 11:12 AM   Modules accepted: Orders

## 2022-06-06 ENCOUNTER — Ambulatory Visit: Payer: Federal, State, Local not specified - PPO | Attending: Cardiology | Admitting: Cardiology

## 2022-06-06 ENCOUNTER — Encounter: Payer: Self-pay | Admitting: Cardiology

## 2022-06-06 VITALS — BP 128/84 | HR 92 | Ht 70.0 in | Wt 194.6 lb

## 2022-06-06 DIAGNOSIS — Z8249 Family history of ischemic heart disease and other diseases of the circulatory system: Secondary | ICD-10-CM

## 2022-06-06 DIAGNOSIS — R9431 Abnormal electrocardiogram [ECG] [EKG]: Secondary | ICD-10-CM

## 2022-06-06 DIAGNOSIS — Z79899 Other long term (current) drug therapy: Secondary | ICD-10-CM

## 2022-06-06 DIAGNOSIS — R931 Abnormal findings on diagnostic imaging of heart and coronary circulation: Secondary | ICD-10-CM | POA: Diagnosis not present

## 2022-06-06 DIAGNOSIS — E785 Hyperlipidemia, unspecified: Secondary | ICD-10-CM | POA: Diagnosis not present

## 2022-06-06 DIAGNOSIS — I10 Essential (primary) hypertension: Secondary | ICD-10-CM | POA: Diagnosis not present

## 2022-06-06 NOTE — Patient Instructions (Signed)
Medication Instructions:   Your physician recommends that you continue on your current medications as directed. Please refer to the Current Medication list given to you today.  *If you need a refill on your cardiac medications before your next appointment, please call your pharmacy*   Lab Work:  TODAY--CMET, LIPIDS, CBC, TSH, AND A1C  If you have labs (blood work) drawn today and your tests are completely normal, you will receive your results only by: MyChart Message (if you have MyChart) OR A paper copy in the mail If you have any lab test that is abnormal or we need to change your treatment, we will call you to review the results.   Testing/Procedures:  Your physician has requested that you have an echocardiogram. Echocardiography is a painless test that uses sound waves to create images of your heart. It provides your doctor with information about the size and shape of your heart and how well your heart's chambers and valves are working. This procedure takes approximately one hour. There are no restrictions for this procedure. Please do NOT wear cologne, perfume, aftershave, or lotions (deodorant is allowed). Please arrive 15 minutes prior to your appointment time.    Follow-Up: At [facility], you and your health needs are our priority.  As part of our continuing mission to provide you with exceptional heart care, we have created designated Provider Care Teams.  These Care Teams include your primary Cardiologist (physician) and Advanced Practice Providers (APPs -  Physician Assistants and Nurse Practitioners) who all work together to provide you with the care you need, when you need it.  We recommend signing up for the patient portal called "MyChart".  Sign up information is provided on this After Visit Summary.  MyChart is used to connect with patients for Virtual Visits (Telemedicine).  Patients are able to view lab/test results, encounter notes, upcoming appointments,  etc.  Non-urgent messages can be sent to your provider as well.   To learn more about what you can do with MyChart, go to [email or web address].    Your next appointment:   1 year(s)  Provider:   DR. Shari Prows

## 2022-06-07 LAB — COMPREHENSIVE METABOLIC PANEL
ALT: 29 IU/L (ref 0–44)
AST: 24 IU/L (ref 0–40)
Albumin/Globulin Ratio: 1.7 (ref 1.2–2.2)
Albumin: 4.7 g/dL (ref 4.1–5.1)
Alkaline Phosphatase: 83 IU/L (ref 44–121)
BUN/Creatinine Ratio: 14 (ref 9–20)
BUN: 12 mg/dL (ref 6–24)
Bilirubin Total: 0.5 mg/dL (ref 0.0–1.2)
CO2: 22 mmol/L (ref 20–29)
Calcium: 9.9 mg/dL (ref 8.7–10.2)
Chloride: 102 mmol/L (ref 96–106)
Creatinine, Ser: 0.84 mg/dL (ref 0.76–1.27)
Globulin, Total: 2.8 g/dL (ref 1.5–4.5)
Glucose: 99 mg/dL (ref 70–99)
Potassium: 4.2 mmol/L (ref 3.5–5.2)
Sodium: 139 mmol/L (ref 134–144)
Total Protein: 7.5 g/dL (ref 6.0–8.5)
eGFR: 106 mL/min/{1.73_m2} (ref >59)

## 2022-06-07 LAB — CBC
Hematocrit: 47 % (ref 37.5–51.0)
Hemoglobin: 16.2 g/dL (ref 13.0–17.7)
MCH: 31.2 pg (ref 26.6–33.0)
MCHC: 34.5 g/dL (ref 31.5–35.7)
MCV: 91 fL (ref 79–97)
Platelets: 315 10*3/uL (ref 150–450)
RBC: 5.19 x10E6/uL (ref 4.14–5.80)
RDW: 12.7 % (ref 11.6–15.4)
WBC: 5.6 10*3/uL (ref 3.4–10.8)

## 2022-06-07 LAB — LIPID PANEL
Chol/HDL Ratio: 3 ratio (ref 0.0–5.0)
Cholesterol, Total: 153 mg/dL (ref 100–199)
HDL: 51 mg/dL (ref >39)
LDL Chol Calc (NIH): 76 mg/dL (ref 0–99)
Triglycerides: 149 mg/dL (ref 0–149)
VLDL Cholesterol Cal: 26 mg/dL (ref 5–40)

## 2022-06-07 LAB — HEMOGLOBIN A1C
Est. average glucose Bld gHb Est-mCnc: 111 mg/dL
Hgb A1c MFr Bld: 5.5 % (ref 4.8–5.6)

## 2022-06-07 LAB — TSH: TSH: 0.991 u[IU]/mL (ref 0.450–4.500)

## 2022-06-14 ENCOUNTER — Other Ambulatory Visit: Payer: Self-pay | Admitting: *Deleted

## 2022-06-14 DIAGNOSIS — E785 Hyperlipidemia, unspecified: Secondary | ICD-10-CM

## 2022-06-14 DIAGNOSIS — R931 Abnormal findings on diagnostic imaging of heart and coronary circulation: Secondary | ICD-10-CM

## 2022-06-14 MED ORDER — ROSUVASTATIN CALCIUM 10 MG PO TABS
10.0000 mg | ORAL_TABLET | Freq: Every day | ORAL | 3 refills | Status: DC
Start: 2022-06-14 — End: 2023-08-27

## 2022-07-04 ENCOUNTER — Ambulatory Visit (HOSPITAL_COMMUNITY): Payer: Federal, State, Local not specified - PPO | Attending: Cardiology

## 2022-07-04 DIAGNOSIS — R9431 Abnormal electrocardiogram [ECG] [EKG]: Secondary | ICD-10-CM

## 2022-07-04 LAB — ECHOCARDIOGRAM COMPLETE
Area-P 1/2: 3.44 cm2
S' Lateral: 2.6 cm

## 2022-09-03 ENCOUNTER — Other Ambulatory Visit: Payer: Self-pay

## 2022-09-03 DIAGNOSIS — E785 Hyperlipidemia, unspecified: Secondary | ICD-10-CM

## 2022-09-03 DIAGNOSIS — E782 Mixed hyperlipidemia: Secondary | ICD-10-CM

## 2022-09-03 DIAGNOSIS — I119 Hypertensive heart disease without heart failure: Secondary | ICD-10-CM

## 2022-09-03 DIAGNOSIS — Z8249 Family history of ischemic heart disease and other diseases of the circulatory system: Secondary | ICD-10-CM

## 2022-09-03 DIAGNOSIS — R931 Abnormal findings on diagnostic imaging of heart and coronary circulation: Secondary | ICD-10-CM

## 2022-09-03 MED ORDER — METOPROLOL SUCCINATE ER 100 MG PO TB24: ORAL_TABLET | ORAL | 3 refills | Status: DC

## 2022-09-03 MED ORDER — LISINOPRIL-HYDROCHLOROTHIAZIDE 20-25 MG PO TABS: 1.0000 | ORAL_TABLET | Freq: Every day | ORAL | 3 refills | Status: DC

## 2022-09-03 MED ORDER — AMLODIPINE BESYLATE 10 MG PO TABS: ORAL_TABLET | ORAL | 3 refills | Status: DC

## 2022-09-03 MED ORDER — ICOSAPENT ETHYL 1 G PO CAPS
ORAL_CAPSULE | ORAL | 3 refills | Status: DC
Start: 2022-09-03 — End: 2023-08-27

## 2023-06-07 ENCOUNTER — Encounter: Payer: Self-pay | Admitting: Cardiology

## 2023-06-07 ENCOUNTER — Ambulatory Visit: Payer: Federal, State, Local not specified - PPO | Attending: Cardiology | Admitting: Cardiology

## 2023-06-07 VITALS — BP 158/98 | HR 88 | Ht 70.0 in | Wt 201.0 lb

## 2023-06-07 DIAGNOSIS — I10 Essential (primary) hypertension: Secondary | ICD-10-CM

## 2023-06-07 DIAGNOSIS — E782 Mixed hyperlipidemia: Secondary | ICD-10-CM

## 2023-06-07 DIAGNOSIS — R931 Abnormal findings on diagnostic imaging of heart and coronary circulation: Secondary | ICD-10-CM

## 2023-06-07 DIAGNOSIS — Z8249 Family history of ischemic heart disease and other diseases of the circulatory system: Secondary | ICD-10-CM

## 2023-06-07 LAB — COMPREHENSIVE METABOLIC PANEL WITH GFR
ALT: 56 IU/L — ABNORMAL HIGH (ref 0–44)
AST: 29 IU/L (ref 0–40)
Albumin: 4.8 g/dL (ref 3.8–4.9)
Alkaline Phosphatase: 93 IU/L (ref 44–121)
BUN/Creatinine Ratio: 15 (ref 9–20)
BUN: 16 mg/dL (ref 6–24)
Bilirubin Total: 0.5 mg/dL (ref 0.0–1.2)
CO2: 23 mmol/L (ref 20–29)
Calcium: 10.4 mg/dL — ABNORMAL HIGH (ref 8.7–10.2)
Chloride: 100 mmol/L (ref 96–106)
Creatinine, Ser: 1.07 mg/dL (ref 0.76–1.27)
Globulin, Total: 2.9 g/dL (ref 1.5–4.5)
Glucose: 99 mg/dL (ref 70–99)
Potassium: 5.1 mmol/L (ref 3.5–5.2)
Sodium: 141 mmol/L (ref 134–144)
Total Protein: 7.7 g/dL (ref 6.0–8.5)
eGFR: 84 mL/min/{1.73_m2} (ref >59)

## 2023-06-07 LAB — LIPID PANEL
Chol/HDL Ratio: 3.3 ratio (ref 0.0–5.0)
Cholesterol, Total: 148 mg/dL (ref 100–199)
HDL: 45 mg/dL (ref >39)
LDL Chol Calc (NIH): 81 mg/dL (ref 0–99)
Triglycerides: 122 mg/dL (ref 0–149)
VLDL Cholesterol Cal: 22 mg/dL (ref 5–40)

## 2023-06-07 NOTE — Patient Instructions (Signed)
 Medication Instructions:  Your physician recommends that you continue on your current medications as directed. Please refer to the Current Medication list given to you today.  *If you need a refill on your cardiac medications before your next appointment, please call your pharmacy*  Lab Work: To be completed today: FASTING lipid panel and CMP  To be completed in 1 yr: FASTING lipid panel and CMP  If you have labs (blood work) drawn today and your tests are completely normal, you will receive your results only by: MyChart Message (if you have MyChart) OR A paper copy in the mail If you have any lab test that is abnormal or we need to change your treatment, we will call you to review the results.  Testing/Procedures: None ordered today.  Follow-Up: At [facility], you and your health needs are our priority.  As part of our continuing mission to provide you with exceptional heart care, we have created designated Provider Care Teams.  These Care Teams include your primary Cardiologist (physician) and Advanced Practice Providers (APPs -  Physician Assistants and Nurse Practitioners) who all work together to provide you with the care you need, when you need it.  We recommend signing up for the patient portal called MyChart.  Sign up information is provided on this After Visit Summary.  MyChart is used to connect with patients for Virtual Visits (Telemedicine).  Patients are able to view lab/test results, encounter notes, upcoming appointments, etc.  Non-urgent messages can be sent to your provider as well.   To learn more about what you can do with MyChart, go to [email or web address].    Your next appointment:   1 year(s)  The format for your next appointment:   In Person  Provider:   Madonna Large, DO{  Other Instructions   1st Floor: - Lobby - Registration  - Pharmacy  - Lab - Cafe  2nd Floor: - PV Lab - Diagnostic Testing (echo, CT, nuclear med)  3rd Floor: -  Vacant  4th Floor: - TCTS (cardiothoracic surgery) - AFib Clinic - Structural Heart Clinic - Vascular Surgery  - Vascular Ultrasound  5th Floor: - HeartCare Cardiology (general and EP) - Clinical Pharmacy for coumadin, hypertension, lipid, weight-loss medications, and med management appointments    Valet parking services will be available as well.

## 2023-06-07 NOTE — Progress Notes (Signed)
 Cardiology Office Note:  .   Date:  06/07/2023  ID:  Leroy Leonard, DOB [birth year], MRN [id number] PCP:  Judy Null, MD  Former Cardiology Providers: Dr. Dorothyann [facility] Providers Cardiologist:  Olinda Bertrand, DO , [facility] (established care 06/07/23) Electrophysiologist:  None  Click to update primary MD,subspecialty MD or APP then REFRESH:1}    Chief Complaint  Patient presents with   Follow-up    Coronary calcification, hyperlipidemia    History of Present Illness: .   Leroy Leonard is a 53 y.o. Caucasian male whose past medical history and cardiovascular risk factors includes: HTN with mild LVH, HLD, SVT and family history of CAD.   Formally under the care of Dr. Ardell Beauvais who last saw Leroy Leonard back in 06/06/2022. I am seeing him for the first time to re-establishing care.   Since last office visit patient denies any anginal chest pain or heart failure symptoms.  No hospitalizations or urgent care visits for cardiovascular reasons.  He has been compliant with his medical therapy.   Physical endurance remains stable, Goes to the gym 3 or 4 times per week (enjoys running on a treadmill for 1 to 2 miles and then does free weights) . Home SBP are around 130/80.   Review of Systems: .   Review of Systems  Cardiovascular:  Negative for chest pain, claudication, irregular heartbeat, leg swelling, near-syncope, orthopnea, palpitations, paroxysmal nocturnal dyspnea and syncope.  Respiratory:  Negative for shortness of breath.   Hematologic/Lymphatic: Negative for bleeding problem.    Studies Reviewed:   EKG: EKG Interpretation Date/Time:  Friday June 07 2023 08:05:55 EDT Ventricular Rate:  82 PR Interval:  148 QRS Duration:  80 QT Interval:  364 QTC Calculation: 425 R Axis:   79  Text Interpretation: Normal sinus rhythm Normal ECG When compared with ECG of 15-Jun-2010 22:48, No significant change was found Confirmed by Olinda Bertrand [phone]) on 06/07/2023  8:14:21 AM  Echocardiogram: 07/04/2022 1. Left ventricular ejection fraction, by estimation, is 60 to 65%. The left ventricle has normal function. The left ventricle has no regional wall motion abnormalities. Left ventricular diastolic parameters were normal. 2. Right ventricular systolic function is normal. The right ventricular size is normal. Tricuspid regurgitation signal is inadequate for assessing PA pressure. 3. The mitral valve is normal in structure. Trivial mitral valve regurgitation. 4. The aortic valve is tricuspid. Aortic valve regurgitation is not visualized. No aortic stenosis is present. 5. The inferior vena cava is normal in size with greater than 50% respiratory variability, suggesting right atrial pressure of 3 mmHg.  Stress Testing: NA  Calcium  Score: 03/2018 Coronary calcium  score of 92. This was 39 percentile for age and sex matched control.  RADIOLOGY: NA  Risk Assessment/Calculations:   The 10-year ASCVD risk score (Arnett DK, et al., 2019) is: 4.4%   Values used to calculate the score:     Age: 34 years     Sex: Male     Is Non-Hispanic African American: No     Diabetic: No     Tobacco smoker: No     Systolic Blood Pressure: 158 mmHg     Is BP treated: Yes     HDL Cholesterol: 51 mg/dL     Total Cholesterol: 153 mg/dL  Labs:       Latest Ref Rng & Units 06/06/2022    9:32 AM 05/05/2021    8:38 AM 04/13/2020    9:57 AM  CBC  WBC 3.4 -  10.8 x10E3/uL 5.6  6.0  6.1   Hemoglobin 13.0 - 17.7 g/dL 16.1  09.6  04.5   Hematocrit 37.5 - 51.0 % 47.0  46.4  47.0   Platelets 150 - 450 x10E3/uL 315  339  399        Latest Ref Rng & Units 06/06/2022    9:32 AM 05/05/2021    8:38 AM 04/13/2020    9:57 AM  BMP  Glucose 70 - 99 mg/dL 99  95  409   BUN 6 - 24 mg/dL 12  9  10    Creatinine 0.76 - 1.27 mg/dL 8.11  9.14  7.82   BUN/Creat Ratio 9 - 20 14  9  13    Sodium 134 - 144 mmol/L 139  137  135   Potassium 3.5 - 5.2 mmol/L 4.2  4.1  4.4   Chloride 96 - 106  mmol/L 102  99  98   CO2 20 - 29 mmol/L 22  25  23    Calcium  8.7 - 10.2 mg/dL 9.9  9.9  95.6       Latest Ref Rng & Units 06/06/2022    9:32 AM 05/05/2021    8:38 AM 04/13/2020    9:57 AM  CMP  Glucose 70 - 99 mg/dL 99  95  213   BUN 6 - 24 mg/dL 12  9  10    Creatinine 0.76 - 1.27 mg/dL 0.86  5.78  4.69   Sodium 134 - 144 mmol/L 139  137  135   Potassium 3.5 - 5.2 mmol/L 4.2  4.1  4.4   Chloride 96 - 106 mmol/L 102  99  98   CO2 20 - 29 mmol/L 22  25  23    Calcium  8.7 - 10.2 mg/dL 9.9  9.9  62.9   Total Protein 6.0 - 8.5 g/dL 7.5  7.5  8.0   Total Bilirubin 0.0 - 1.2 mg/dL 0.5  0.6  0.6   Alkaline Phos 44 - 121 IU/L 83  59  79   AST 0 - 40 IU/L 24  20  17    ALT 0 - 44 IU/L 29  28  28      Lab Results  Component Value Date   CHOL 153 06/06/2022   HDL 51 06/06/2022   LDLCALC 76 06/06/2022   LDLDIRECT 139.6 01/11/2014   TRIG 149 06/06/2022   CHOLHDL 3.0 06/06/2022   No results for input(s): "LIPOA" in the last 8760 hours. No components found for: "NTPROBNP" No results for input(s): "PROBNP" in the last 8760 hours. No results for input(s): "TSH" in the last 8760 hours.  Physical Exam:    Today's Vitals   06/07/23 0807  BP: (!) 158/98  Pulse: 88  SpO2: 98%  Weight: 201 lb (91.2 kg)  Height: 5\' 10"  (1.778 m)   Body mass index is 28.84 kg/m. Wt Readings from Last 3 Encounters:  06/07/23 201 lb (91.2 kg)  06/06/22 194 lb 9.6 oz (88.3 kg)  05/05/21 195 lb (88.5 kg)    Physical Exam  Constitutional: No distress.  hemodynamically stable  Neck: No JVD present.  Cardiovascular: Normal rate, regular rhythm, S1 normal and S2 normal. Exam reveals no gallop, no S3 and no S4.  No murmur heard. Pulmonary/Chest: Effort normal and breath sounds normal. No stridor. He has no wheezes. He has no rales.  Musculoskeletal:        General: No edema.     Cervical back: Neck supple.  Skin: Skin is warm.  Impression & Recommendation(s):  Impression:   ICD-10-CM   1. Agatston  coronary artery calcium  score less than 100  R93.1     2. Benign hypertension  I10 EKG 12-Lead    3. Mixed hyperlipidemia  E78.2 Comprehensive metabolic panel with GFR    Lipid panel    Lipid panel    Comprehensive metabolic panel with GFR    4. Family history of early CAD  Z82.49        Recommendation(s):  Agatston coronary artery calcium  score less than 100 Total coronary calcium  score back in 2022 was 92 placing him at the 93rd percentile. Continue statins and Vascepa   Benign hypertension Office blood pressures are not well-controlled. Home blood pressures usually are around 130/80 mmHg. He takes all of his blood pressure medications in the morning. I have asked him to take Toprol -XL and lisinopril /HCTZ in the morning and amlodipine  at night at the current doses. Continue to check ambulatory blood pressure readings if readings are not at goal further medication titration may be warranted. Reemphasized importance of low-salt diet.  Mixed hyperlipidemia No recent fasting lipid profile is available for review. Patient requesting to recheck his lipids. Will check fasting lipids and CMP. Will also order another set of lipids and CMP for next year. Continue Crestor  10 mg p.o. daily. Continue Vascepa  1 g 2 tabs daily  We discussed management of at least 2 chronic comorbid conditions, reviewed his echocardiogram from May 2024, last progress note from Dr. Ardell Beauvais in April 2024, medication management, diagnostic testing ordered.  Orders Placed:  Orders Placed This Encounter  Procedures   Comprehensive metabolic panel with GFR    Standing Status:   Future    Number of Occurrences:   1    Expected Date:   06/07/2023    Expiration Date:   06/06/2024   Lipid panel    Standing Status:   Future    Number of Occurrences:   1    Expected Date:   06/07/2023    Expiration Date:   06/06/2024   EKG 12-Lead     Final Medication List:   No orders of the defined types were placed in  this encounter.   There are no discontinued medications.   Current Outpatient Medications:    amLODipine  (NORVASC ) 10 MG tablet, TAKE 1 TABLET(10 MG) BY MOUTH DAILY, Disp: 90 tablet, Rfl: 3   Ascorbic Acid (VITAMIN C PO), Take 1 tablet by mouth as needed., Disp: , Rfl:    icosapent  Ethyl (VASCEPA ) 1 g capsule, TAKE 2 CAPSULES(2 GRAMS) BY MOUTH DAILY, Disp: 180 capsule, Rfl: 3   lisinopril -hydrochlorothiazide  (ZESTORETIC ) 20-25 MG tablet, Take 1 tablet by mouth daily., Disp: 90 tablet, Rfl: 3   metoprolol  succinate (TOPROL -XL) 100 MG 24 hr tablet, Take one (1) tablet by mouth ( 100 mg ) daily. Take with or immediately following a meal., Disp: 90 tablet, Rfl: 3   Multiple Vitamins-Minerals (ZINC PO), Take 1 tablet by mouth as needed., Disp: , Rfl:    rosuvastatin  (CRESTOR ) 10 MG tablet, Take 1 tablet (10 mg total) by mouth daily., Disp: 90 tablet, Rfl: 3   VITAMIN D PO, Take 1 tablet by mouth daily., Disp: , Rfl:   Consent:   NA  Disposition:   1 year follow-up sooner if needed.  His questions and concerns were addressed to his satisfaction. He voices understanding of the recommendations provided during this encounter.    Signed, Olinda Bertrand, DO, [facility]  [facility]  [address] [place], [place] [address] 06/07/2023 8:31 AM

## 2023-06-08 ENCOUNTER — Encounter: Payer: Self-pay | Admitting: Cardiology

## 2023-08-27 ENCOUNTER — Other Ambulatory Visit: Payer: Self-pay

## 2023-08-27 DIAGNOSIS — Z8249 Family history of ischemic heart disease and other diseases of the circulatory system: Secondary | ICD-10-CM

## 2023-08-27 DIAGNOSIS — E785 Hyperlipidemia, unspecified: Secondary | ICD-10-CM

## 2023-08-27 DIAGNOSIS — E782 Mixed hyperlipidemia: Secondary | ICD-10-CM

## 2023-08-27 DIAGNOSIS — R931 Abnormal findings on diagnostic imaging of heart and coronary circulation: Secondary | ICD-10-CM

## 2023-08-27 DIAGNOSIS — I119 Hypertensive heart disease without heart failure: Secondary | ICD-10-CM

## 2023-08-27 MED ORDER — METOPROLOL SUCCINATE ER 100 MG PO TB24: ORAL_TABLET | ORAL | 2 refills | Status: AC

## 2023-08-27 MED ORDER — ICOSAPENT ETHYL 1 G PO CAPS: ORAL_CAPSULE | ORAL | 2 refills | Status: AC

## 2023-08-27 MED ORDER — LISINOPRIL-HYDROCHLOROTHIAZIDE 20-25 MG PO TABS: 1.0000 | ORAL_TABLET | Freq: Every day | ORAL | 2 refills | Status: AC

## 2023-08-27 MED ORDER — ROSUVASTATIN CALCIUM 10 MG PO TABS: 10.0000 mg | ORAL_TABLET | Freq: Every day | ORAL | 2 refills | Status: AC

## 2023-08-27 MED ORDER — AMLODIPINE BESYLATE 10 MG PO TABS: ORAL_TABLET | ORAL | 2 refills | Status: AC

## 2024-06-15 ENCOUNTER — Ambulatory Visit: Admitting: Cardiology
